# Patient Record
Sex: Male | Born: 1960 | Race: White | Hispanic: No | Smoking: Former smoker
Health system: Southern US, Community
[De-identification: ages and names within clinical notes are randomized; demographics above are authoritative.]

## PROBLEM LIST (undated history)

## (undated) DIAGNOSIS — R001 Bradycardia, unspecified: Secondary | ICD-10-CM

## (undated) DIAGNOSIS — E785 Hyperlipidemia, unspecified: Secondary | ICD-10-CM

## (undated) DIAGNOSIS — R7989 Other specified abnormal findings of blood chemistry: Secondary | ICD-10-CM

## (undated) DIAGNOSIS — I2510 Atherosclerotic heart disease of native coronary artery without angina pectoris: Secondary | ICD-10-CM

## (undated) DIAGNOSIS — Z720 Tobacco use: Secondary | ICD-10-CM

---

## 2008-09-07 ENCOUNTER — Emergency Department (HOSPITAL_BASED_OUTPATIENT_CLINIC_OR_DEPARTMENT_OTHER): Admission: EM | Admit: 2008-09-07 | Discharge: 2008-09-07 | Payer: Self-pay | Admitting: Emergency Medicine

## 2013-06-26 ENCOUNTER — Encounter (HOSPITAL_COMMUNITY): Payer: Self-pay | Admitting: Family Medicine

## 2013-06-26 ENCOUNTER — Inpatient Hospital Stay (HOSPITAL_COMMUNITY)
Admission: EM | Admit: 2013-06-26 | Discharge: 2013-06-28 | DRG: 249 | Disposition: A | Discharge status: Home / Self Care | Payer: 59 | Attending: Cardiology | Admitting: Cardiology

## 2013-06-26 ENCOUNTER — Emergency Department (HOSPITAL_COMMUNITY): Payer: Self-pay

## 2013-06-26 DIAGNOSIS — E039 Hypothyroidism, unspecified: Secondary | ICD-10-CM | POA: Diagnosis present

## 2013-06-26 DIAGNOSIS — I4510 Unspecified right bundle-branch block: Secondary | ICD-10-CM | POA: Diagnosis present

## 2013-06-26 DIAGNOSIS — I214 Non-ST elevation (NSTEMI) myocardial infarction: Principal | ICD-10-CM | POA: Diagnosis present

## 2013-06-26 DIAGNOSIS — Z720 Tobacco use: Secondary | ICD-10-CM

## 2013-06-26 DIAGNOSIS — R7989 Other specified abnormal findings of blood chemistry: Secondary | ICD-10-CM

## 2013-06-26 DIAGNOSIS — I2510 Atherosclerotic heart disease of native coronary artery without angina pectoris: Secondary | ICD-10-CM | POA: Diagnosis present

## 2013-06-26 DIAGNOSIS — I498 Other specified cardiac arrhythmias: Secondary | ICD-10-CM | POA: Diagnosis present

## 2013-06-26 DIAGNOSIS — I249 Acute ischemic heart disease, unspecified: Secondary | ICD-10-CM

## 2013-06-26 DIAGNOSIS — F17200 Nicotine dependence, unspecified, uncomplicated: Secondary | ICD-10-CM | POA: Diagnosis present

## 2013-06-26 DIAGNOSIS — Z8249 Family history of ischemic heart disease and other diseases of the circulatory system: Secondary | ICD-10-CM

## 2013-06-26 DIAGNOSIS — R001 Bradycardia, unspecified: Secondary | ICD-10-CM

## 2013-06-26 DIAGNOSIS — E785 Hyperlipidemia, unspecified: Secondary | ICD-10-CM

## 2013-06-26 DIAGNOSIS — Z955 Presence of coronary angioplasty implant and graft: Secondary | ICD-10-CM

## 2013-06-26 HISTORY — DX: Tobacco use: Z72.0

## 2013-06-26 HISTORY — DX: Bradycardia, unspecified: R00.1

## 2013-06-26 HISTORY — DX: Hyperlipidemia, unspecified: E78.5

## 2013-06-26 HISTORY — DX: Other specified abnormal findings of blood chemistry: R79.89

## 2013-06-26 HISTORY — DX: Atherosclerotic heart disease of native coronary artery without angina pectoris: I25.10

## 2013-06-26 LAB — CBC
HCT: 46.9 % (ref 39.0–52.0)
Hemoglobin: 16.3 g/dL (ref 13.0–17.0)
MCHC: 34.8 g/dL (ref 30.0–36.0)
RBC: 5.38 MIL/uL (ref 4.22–5.81)
WBC: 11.8 10*3/uL — ABNORMAL HIGH (ref 4.0–10.5)

## 2013-06-26 LAB — BASIC METABOLIC PANEL
BUN: 13 mg/dL (ref 6–23)
CO2: 23 mEq/L (ref 19–32)
Chloride: 107 mEq/L (ref 96–112)
GFR calc non Af Amer: 72 mL/min — ABNORMAL LOW (ref >90)
Glucose, Bld: 118 mg/dL — ABNORMAL HIGH (ref 70–99)
Potassium: 4.1 mEq/L (ref 3.5–5.1)
Sodium: 141 mEq/L (ref 135–145)

## 2013-06-26 LAB — POCT I-STAT TROPONIN I: Troponin i, poc: 0.93 ng/mL (ref 0.00–0.08)

## 2013-06-26 LAB — TROPONIN I: Troponin I: 3.24 ng/mL (ref <0.30)

## 2013-06-26 LAB — OCCULT BLOOD, POC DEVICE: Fecal Occult Bld: NEGATIVE

## 2013-06-26 MED ORDER — GI COCKTAIL ~~LOC~~
30.0000 mL | Freq: Once | ORAL | Status: AC
  Administered 2013-06-26: 30 mL via ORAL
  Filled 2013-06-26: qty 30

## 2013-06-26 MED ORDER — HEPARIN BOLUS VIA INFUSION
4000.0000 [IU] | Freq: Once | INTRAVENOUS | Status: AC
  Administered 2013-06-26: 4000 [IU] via INTRAVENOUS

## 2013-06-26 MED ORDER — NITROGLYCERIN 2 % TD OINT
0.5000 [in_us] | TOPICAL_OINTMENT | Freq: Three times a day (TID) | TRANSDERMAL | Status: DC
  Administered 2013-06-27 – 2013-06-28 (×3): 0.5 [in_us] via TOPICAL
  Filled 2013-06-26: qty 30
  Filled 2013-06-26: qty 1

## 2013-06-26 MED ORDER — HEPARIN (PORCINE) IN NACL 100-0.45 UNIT/ML-% IJ SOLN
1200.0000 [IU]/h | INTRAMUSCULAR | Status: DC
  Administered 2013-06-26: 1000 [IU]/h via INTRAVENOUS
  Administered 2013-06-27: 10:00:00 1200 [IU]/h via INTRAVENOUS
  Filled 2013-06-26 (×2): qty 250

## 2013-06-26 MED ORDER — TETANUS-DIPHTH-ACELL PERTUSSIS 5-2.5-18.5 LF-MCG/0.5 IM SUSP
0.5000 mL | Freq: Once | INTRAMUSCULAR | Status: AC
  Administered 2013-06-26: 0.5 mL via INTRAMUSCULAR
  Filled 2013-06-26: qty 0.5

## 2013-06-26 MED ORDER — NITROGLYCERIN 0.4 MG SL SUBL
0.4000 mg | SUBLINGUAL_TABLET | SUBLINGUAL | Status: DC | PRN
  Administered 2013-06-26: 0.4 mg via SUBLINGUAL
  Filled 2013-06-26 (×2): qty 25

## 2013-06-26 MED ORDER — SODIUM CHLORIDE 0.9 % IV BOLUS (SEPSIS)
500.0000 mL | Freq: Once | INTRAVENOUS | Status: AC
  Administered 2013-06-26: 500 mL via INTRAVENOUS

## 2013-06-26 MED ORDER — ASPIRIN 81 MG PO CHEW
324.0000 mg | CHEWABLE_TABLET | Freq: Once | ORAL | Status: AC
  Administered 2013-06-26: 324 mg via ORAL
  Filled 2013-06-26: qty 1
  Filled 2013-06-26: qty 3

## 2013-06-26 NOTE — ED Notes (Signed)
No answer after called x 3

## 2013-06-26 NOTE — ED Notes (Signed)
Per pt sts pressure in his chest since about 12:00. sts radiating down both arm and nausea. sts pain 9/10

## 2013-06-26 NOTE — ED Provider Notes (Signed)
History    CSN: [id number] Arrival date & time 06/26/13  1842  First MD Initiated Contact with Patient 06/26/13 1921     Chief Complaint  Patient presents with  . Chest Pain   HPI  History provided by the patient and significant other. Patient is a 52 year old male who is occurring every day smoker without a primary care provider or other known PMH presenting with complaints of persistent mid substernal chest pressure, burning and discomfort. Symptoms first began around noon and has continued throughout the day. Patient also has some pain radiating into the right upper extremity with numbness in the arm. Denies radiation to the left extremity. Symptoms were associated with nausea and episode of vomiting with blood and some blood clots. Patient denies having any epigastric pains. Denies any significant history of acid reflux or GERD. Patient has not taken any medications for his symptoms. There is no associated diaphoresis or shortness of breath. No heart palpitations. Patient does report: With his wife on a motorcycle ride yesterday for 2-3 hours in the hot sun. He states they both felt tired and slightly bad after being all all day as if they were slightly dehydrated. He denies any other recent changes. He had normal appetite this morning. Denies any fever, chills or sweats. No cough. No hemoptysis. No pleuritic chest pain. No swelling of the extremities.    History reviewed. No pertinent past medical history. History reviewed. No pertinent past surgical history. History reviewed. No pertinent family history. History  Substance Use Topics  . Smoking status: Current Every Day Smoker  . Smokeless tobacco: Not on file  . Alcohol Use: No    Review of Systems  Constitutional: Negative for fever, chills, diaphoresis and appetite change.  Respiratory: Negative for cough and shortness of breath.   Cardiovascular: Positive for chest pain. Negative for palpitations and leg swelling.   Gastrointestinal: Positive for nausea and vomiting. Negative for abdominal pain, diarrhea and constipation.  Neurological: Negative for headaches.  All other systems reviewed and are negative.    Allergies  Review of patient's allergies indicates no known allergies.  Home Medications  No current outpatient prescriptions on file. BP 121/82  Pulse 67  Temp(Src) 98.3 F (36.8 C) (Oral)  Resp 16  Ht 5\' 9"  (1.753 m)  Wt 196 lb (88.905 kg)  BMI 28.93 kg/m2  SpO2 96% Physical Exam  Nursing note and vitals reviewed. Constitutional: He is oriented to person, place, and time. He appears well-developed and well-nourished. No distress.  HENT:  Head: Normocephalic and atraumatic.  Mouth/Throat: Oropharynx is clear and moist.  Neck: Normal range of motion. Neck supple. No JVD present. No thyromegaly present.  Cardiovascular: Normal rate and regular rhythm.   No murmur heard. Pulmonary/Chest: Effort normal and breath sounds normal. No stridor. No respiratory distress. He has no wheezes. He has no rales.  Abdominal: Soft. He exhibits no distension. There is no tenderness. There is no rebound and no guarding.  Musculoskeletal: Normal range of motion. He exhibits no edema and no tenderness.  Neurological: He is alert and oriented to person, place, and time. He has normal strength. No sensory deficit.  Skin: Skin is warm.  Psychiatric: He has a normal mood and affect. His behavior is normal.    ED Course  Procedures  Results for orders placed during the hospital encounter of 06/26/13  CBC      Result Value Range   WBC 11.8 (*) 4.0 - 10.5 K/uL   RBC 5.38  4.22 -  5.81 MIL/uL   Hemoglobin 16.3  13.0 - 17.0 g/dL   HCT 16.1  09.6 - 04.5 %   MCV 87.2  78.0 - 100.0 fL   MCH 30.3  26.0 - 34.0 pg   MCHC 34.8  30.0 - 36.0 g/dL   RDW 40.9  81.1 - 91.4 %   Platelets 241  150 - 400 K/uL  BASIC METABOLIC PANEL      Result Value Range   Sodium 141  135 - 145 mEq/L   Potassium 4.1  3.5 - 5.1  mEq/L   Chloride 107  96 - 112 mEq/L   CO2 23  19 - 32 mEq/L   Glucose, Bld 118 (*) 70 - 99 mg/dL   BUN 13  6 - 23 mg/dL   Creatinine, Ser 7.82  0.50 - 1.35 mg/dL   Calcium 9.6  8.4 - 95.6 mg/dL   GFR calc non Af Amer 72 (*) >90 mL/min   GFR calc Af Amer 83 (*) >90 mL/min  TROPONIN I      Result Value Range   Troponin I 3.24 (*) <0.30 ng/mL  POCT I-STAT TROPONIN I      Result Value Range   Troponin i, poc 0.05  0.00 - 0.08 ng/mL   Comment 3           POCT I-STAT TROPONIN I      Result Value Range   Troponin i, poc 0.93 (*) 0.00 - 0.08 ng/mL   Comment NOTIFIED PHYSICIAN     Comment 3           OCCULT BLOOD, POC DEVICE      Result Value Range   Fecal Occult Bld NEGATIVE  NEGATIVE        Dg Chest 2 View  06/26/2013   *RADIOLOGY REPORT*  Clinical Data: Chest pain, cough  CHEST - 2 VIEW  Comparison: None.  Findings: There is mild diffuse interstitial prominence.  No focal airspace consolidation.  No effusion.  No overt edema.  Heart size normal.  Regional bones unremarkable.  IMPRESSION:  Mild interstitial prominence of uncertain chronicity.   Original Report Authenticated By: D. Andria Rhein, MD      1. NSTEMI (non-ST elevated myocardial infarction)   2. ACS (acute coronary syndrome)   3. Non-STEMI (non-ST elevated myocardial infarction)       MDM  8:25 PM patient seen and evaluated. Patient resting appears comfortable no acute distress. He continues to have some central substernal chest pressure, burning and discomfort.  10:00 PM patient with second elevated troponin. He does report slight improvements of chest pain or mild persistent discomfort. Stable blood pressure and heart rate. Patient discussed with attending physician. Will consult cardiology and give nitroglycerin.  10:20PM Hemoccult is negative however with history of possible hematemesis we'll consult cardiology first before starting heparin  10:40PM Spoke with Cardiology they will come and see pt.  They would  like repeat troponin sent to lab.  Cardiology has seen patient and will admit    Date: 06/26/2013  Rate: 58  Rhythm: sinus bradycardia  QRS Axis: normal  Intervals: normal  ST/T Wave abnormalities: nonspecific ST/T changes  Conduction Disutrbances: Incomplete right bundle branch block  Narrative Interpretation: Possible RVH, abnormal EKG  Old EKG Reviewed: none available       Date: 06/26/2013  Rate: 51  Rhythm: sinus bradycardia  QRS Axis: normal  Intervals: normal  ST/T Wave abnormalities: nonspecific ST/T changes  Conduction Disutrbances: Incomplete RBBB  Narrative Interpretation:  Old EKG Reviewed: unchanged      Angus Seller, PA-C 06/26/13 2346

## 2013-06-26 NOTE — H&P (Addendum)
Physician History and Physical    Vincent Henry MRN: [id number] DOB/AGE: 1961/05/20 52 y.o. Admit date: 06/26/2013  HPI: 52 yo with history of smoking presents with chest pain and suspected NSTEMI.  Patient has minimal past history and no known cardiac problems.  He has not seen a doctor for years.  Today, he was at working Social worker.  Around noon, he developed substernal chest pressure after eating a hotdog for lunch.  The pain continued on and off all afternoon.  Around 5:30, he got nauseated and vomited x 1.  The emesis was dark but he had had a Coke prior to vomiting.  Later that evening, he had chest pressure with numbness/tingling in his right arm so decided to come to the ER.  In the ER, a GI cocktail helped some but NTG sublingual resolved pain completely.  He is now pain-free.  Initial troponin was negative but repeat troponin was elevated to 0.93.  ECG showed lateral TWIs and biphasic Ts in V4/V5. Patient was guaiac negative in the ER.   Patient smokes about 1.5 ppd.  He also had a brother with stents placed in his early 91s and father with CABG in his 76s.   Review of systems complete and found to be negative unless listed above   PMH: - Active smoker  FH: Father with CABG in his 56s.  Brother with PCI in his 73s.   History   Social History  . Marital Status: Married    Spouse Name: N/A    Number of Children: N/A  . Years of Education: N/A   Occupational History  . Not on file.   Social History Main Topics  . Smoking status: Current Every Day Smoker, 1.5 ppd  . Smokeless tobacco: Not on file  . Alcohol Use: No  . Drug Use: Not on file  . Sexually Active: Not on file   Other Topics Concern  . Not on file   Social History Narrative  . Lives in [place].  Repairs golf carts    Physical Exam: Blood pressure 126/77, pulse 67, temperature 98.3 F (36.8 C), temperature source Oral, resp. rate 16, height 5\' 9"  (1.753 m), weight 196 lb (88.905 kg), SpO2  99.00%.  General: NAD Neck: No JVD, no thyromegaly or thyroid nodule.  Lungs: Rhonchi bilaterally CV: Nondisplaced PMI.  Heart regular S1/S2, no S3/S4, no murmur.  No peripheral edema.  No carotid bruit.  Normal pedal pulses.  Abdomen: Soft, nontender, no hepatosplenomegaly, no distention.  Skin: Intact without lesions or rashes.  Neurologic: Alert and oriented x 3.  Psych: Normal affect. Extremities: No clubbing or cyanosis.  HEENT: Normal.   Labs:   Lab Results  Component Value Date   WBC 11.8* 06/26/2013   HGB 16.3 06/26/2013   HCT 46.9 06/26/2013   MCV 87.2 06/26/2013   PLT 241 06/26/2013    Recent Labs Lab 06/26/13 1853  NA 141  K 4.1  CL 107  CO2 23  BUN 13  CREATININE 1.17  CALCIUM 9.6  GLUCOSE 118*  TnI 0.05 => 0.93   Radiology: - CXR: interstitial prominence  EKG: NSR, rSR' V1, lateral TWIs, biphasic Ts in V4/V5  ASSESSMENT AND PLAN:  52 yo smoker with strong FH of CAD presents with NSTEMI.  1. CAD: NSTEMI with elevated troponin and T wave inversions on ECG.  He is currently CP-free after NTG. - Start heparin gtt, place NTG paste 1/2 inch. - He has had ASA. - High dose statin.  -  Hold off on beta blocker with bradycardia (HR around 50).   - LHC in am if he remains CP-free.  2. Smoking: Active smoker.  I strongly encouraged him to quit.  Will place nicotine patch.  3. Bradycardia: Sinus brady with HR high 40s-50s.  No beta blocker.  Follow.   Signed: Marca Ancona 06/26/2013, 10:43 PM

## 2013-06-26 NOTE — ED Notes (Signed)
CRITICAL LAB RESULTS SHOWN TO DR.PICKERING

## 2013-06-26 NOTE — ED Notes (Signed)
No answer  When called from lobby

## 2013-06-26 NOTE — Progress Notes (Signed)
ANTICOAGULATION CONSULT NOTE - Initial Consult  Pharmacy Consult for Heparin Indication: chest pain/ACS  No Known Allergies  Patient Measurements: Height: 5\' 9"  (175.3 cm) Weight: 196 lb (88.905 kg) IBW/kg (Calculated) : 70.7  Vital Signs: Temp: 98.3 F (36.8 C) (07/14 1845) Temp src: Oral (07/14 1845) BP: 88/47 mmHg (07/14 2300) Pulse Rate: 67 (07/14 1845)  Labs:  Recent Labs  06/26/13 1853  HGB 16.3  HCT 46.9  PLT 241  CREATININE 1.17    Estimated Creatinine Clearance: 84.3 ml/min (by C-G formula based on Cr of 1.17).   Medical History: History reviewed. No pertinent past medical history.  Medications:  None  Assessment: 52 y.o. male with chest pain for heparin  Goal of Therapy:  Heparin level 0.3-0.7 units/ml Monitor platelets by anticoagulation protocol: Yes   Plan:  Heparin 4000 units IV bolus, then 1000 units/hr Check heparin level in 6 hours.  Eddie Candle 06/26/2013,11:32 PM

## 2013-06-27 ENCOUNTER — Encounter (HOSPITAL_COMMUNITY)
Admission: EM | Disposition: A | Discharge status: Home / Self Care | Payer: Self-pay | Source: Home / Self Care | Attending: Cardiology

## 2013-06-27 ENCOUNTER — Other Ambulatory Visit: Payer: Self-pay

## 2013-06-27 DIAGNOSIS — I214 Non-ST elevation (NSTEMI) myocardial infarction: Secondary | ICD-10-CM

## 2013-06-27 DIAGNOSIS — I2510 Atherosclerotic heart disease of native coronary artery without angina pectoris: Secondary | ICD-10-CM

## 2013-06-27 HISTORY — PX: PERCUTANEOUS STENT INTERVENTION: SHX5500

## 2013-06-27 HISTORY — DX: Atherosclerotic heart disease of native coronary artery without angina pectoris: I25.10

## 2013-06-27 HISTORY — PX: CORONARY ANGIOPLASTY WITH STENT PLACEMENT: SHX49

## 2013-06-27 HISTORY — PX: LEFT HEART CATHETERIZATION WITH CORONARY ANGIOGRAM: SHX5451

## 2013-06-27 LAB — BASIC METABOLIC PANEL
BUN: 14 mg/dL (ref 6–23)
CO2: 22 mEq/L (ref 19–32)
Calcium: 8.9 mg/dL (ref 8.4–10.5)
Creatinine, Ser: 1.15 mg/dL (ref 0.50–1.35)
GFR calc non Af Amer: 73 mL/min — ABNORMAL LOW (ref >90)
Glucose, Bld: 94 mg/dL (ref 70–99)
Sodium: 141 mEq/L (ref 135–145)

## 2013-06-27 LAB — PROTIME-INR
INR: 1.04 (ref 0.00–1.49)
INR: 1.12 (ref 0.00–1.49)
Prothrombin Time: 13.4 seconds (ref 11.6–15.2)
Prothrombin Time: 14.2 seconds (ref 11.6–15.2)

## 2013-06-27 LAB — LIPID PANEL
Cholesterol: 151 mg/dL (ref 0–200)
Total CHOL/HDL Ratio: 4.7 RATIO

## 2013-06-27 LAB — TROPONIN I: Troponin I: 4.45 ng/mL (ref <0.30)

## 2013-06-27 LAB — CBC
MCH: 29.8 pg (ref 26.0–34.0)
MCHC: 34.1 g/dL (ref 30.0–36.0)
MCV: 87.4 fL (ref 78.0–100.0)
Platelets: 181 10*3/uL (ref 150–400)
RDW: 13.5 % (ref 11.5–15.5)

## 2013-06-27 LAB — APTT: aPTT: 30 seconds (ref 24–37)

## 2013-06-27 LAB — TSH: TSH: 12.332 u[IU]/mL — ABNORMAL HIGH (ref 0.350–4.500)

## 2013-06-27 LAB — POCT ACTIVATED CLOTTING TIME: Activated Clotting Time: 462 seconds

## 2013-06-27 SURGERY — LEFT HEART CATHETERIZATION WITH CORONARY ANGIOGRAM
Anesthesia: LOCAL

## 2013-06-27 MED ORDER — SODIUM CHLORIDE 0.9 % IV SOLN: 0.2500 mg/kg/h | INTRAVENOUS | Status: AC

## 2013-06-27 MED ORDER — ONDANSETRON HCL 4 MG/2ML IJ SOLN: 4.0000 mg | Freq: Four times a day (QID) | INTRAMUSCULAR | Status: DC | PRN

## 2013-06-27 MED ORDER — PRASUGREL HCL 10 MG PO TABS
10.0000 mg | ORAL_TABLET | Freq: Every day | ORAL | Status: DC
  Filled 2013-06-27 (×2): qty 1

## 2013-06-27 MED ORDER — ASPIRIN 81 MG PO CHEW: 324.0000 mg | CHEWABLE_TABLET | ORAL | Status: DC

## 2013-06-27 MED ORDER — SODIUM CHLORIDE 0.9 % IV SOLN: 250.0000 mL | INTRAVENOUS | Status: DC | PRN

## 2013-06-27 MED ORDER — PRASUGREL HCL 10 MG PO TABS
ORAL_TABLET | ORAL | Status: AC
  Administered 2013-06-28: 10:00:00 10 mg via ORAL
  Filled 2013-06-27: qty 6

## 2013-06-27 MED ORDER — SODIUM CHLORIDE 0.9 % IV SOLN
INTRAVENOUS | Status: AC
  Administered 2013-06-27: 16:00:00 via INTRAVENOUS

## 2013-06-27 MED ORDER — SODIUM CHLORIDE 0.9 % IV SOLN
1.0000 mL/kg/h | INTRAVENOUS | Status: DC
  Administered 2013-06-27 (×2): 1 mL/kg/h via INTRAVENOUS

## 2013-06-27 MED ORDER — SODIUM CHLORIDE 0.9 % IJ SOLN: 3.0000 mL | INTRAMUSCULAR | Status: DC | PRN

## 2013-06-27 MED ORDER — SODIUM CHLORIDE 0.9 % IJ SOLN: 3.0000 mL | Freq: Two times a day (BID) | INTRAMUSCULAR | Status: DC

## 2013-06-27 MED ORDER — NITROGLYCERIN 0.2 MG/ML ON CALL CATH LAB
INTRAVENOUS | Status: AC
  Filled 2013-06-27: qty 1

## 2013-06-27 MED ORDER — LIDOCAINE HCL (PF) 1 % IJ SOLN
INTRAMUSCULAR | Status: AC
  Filled 2013-06-27: qty 30

## 2013-06-27 MED ORDER — MIDAZOLAM HCL 2 MG/2ML IJ SOLN
INTRAMUSCULAR | Status: AC
  Filled 2013-06-27: qty 2

## 2013-06-27 MED ORDER — VERAPAMIL HCL 2.5 MG/ML IV SOLN
INTRAVENOUS | Status: AC
  Filled 2013-06-27: qty 2

## 2013-06-27 MED ORDER — ASPIRIN 300 MG RE SUPP: 300.0000 mg | RECTAL | Status: DC

## 2013-06-27 MED ORDER — HEPARIN (PORCINE) IN NACL 2-0.9 UNIT/ML-% IJ SOLN
INTRAMUSCULAR | Status: AC
  Filled 2013-06-27: qty 1000

## 2013-06-27 MED ORDER — FENTANYL CITRATE 0.05 MG/ML IJ SOLN
INTRAMUSCULAR | Status: AC
  Filled 2013-06-27: qty 2

## 2013-06-27 MED ORDER — ASPIRIN 81 MG PO CHEW
324.0000 mg | CHEWABLE_TABLET | ORAL | Status: AC
  Administered 2013-06-27: 324 mg via ORAL
  Filled 2013-06-27: qty 4

## 2013-06-27 MED ORDER — BIVALIRUDIN 250 MG IV SOLR
INTRAVENOUS | Status: AC
  Filled 2013-06-27: qty 250

## 2013-06-27 MED ORDER — ATORVASTATIN CALCIUM 80 MG PO TABS
80.0000 mg | ORAL_TABLET | Freq: Every day | ORAL | Status: DC
  Administered 2013-06-27 (×2): 80 mg via ORAL
  Filled 2013-06-27 (×3): qty 1

## 2013-06-27 MED ORDER — ASPIRIN EC 81 MG PO TBEC
81.0000 mg | DELAYED_RELEASE_TABLET | Freq: Every day | ORAL | Status: DC
  Administered 2013-06-28: 81 mg via ORAL
  Filled 2013-06-27 (×2): qty 1

## 2013-06-27 MED ORDER — ACETAMINOPHEN 325 MG PO TABS: 650.0000 mg | ORAL_TABLET | ORAL | Status: DC | PRN

## 2013-06-27 MED ORDER — HEPARIN SODIUM (PORCINE) 1000 UNIT/ML IJ SOLN
INTRAMUSCULAR | Status: AC
  Filled 2013-06-27: qty 1

## 2013-06-27 MED ORDER — NITROGLYCERIN 0.4 MG SL SUBL: 0.4000 mg | SUBLINGUAL_TABLET | SUBLINGUAL | Status: DC | PRN

## 2013-06-27 NOTE — Interval H&P Note (Signed)
History and Physical Interval Note:  06/27/2013 11:36 AM  Vincent Henry  has presented today for surgery, with the diagnosis of NSTEMI  The various methods of treatment have been discussed with the patient and family. After consideration of risks, benefits and other options for treatment, the patient has consented to  Procedure(s): LEFT HEART CATHETERIZATION WITH CORONARY ANGIOGRAM (N/A) as a surgical intervention .  The patient's history has been reviewed, patient examined, no change in status, stable for surgery.  I have reviewed the patient's chart and labs.  Questions were answered to the patient's satisfaction.    Patient's troponin is > 4.  Agree with plans for cath.  Radial approach .  Elyn Aquas.

## 2013-06-27 NOTE — ED Provider Notes (Signed)
Medical screening examination/treatment/procedure(s) were performed by non-physician practitioner and as supervising physician I was immediately available for consultation/collaboration.  Juliet Rude. Rubin Payor, MD 06/27/13 4098

## 2013-06-27 NOTE — CV Procedure (Signed)
    Cardiac Cath Note  Vincent Henry [id number] Mar 22, 1961  Procedure: left  Heart Cardiac Catheterization Note Indications: NSTEMI  Procedure Details Consent: Obtained Time Out: Verified patient identification, verified procedure, site/side was marked, verified correct patient position, special equipment/implants available, Radiology Safety Procedures followed,  medications/allergies/relevent history reviewed, required imaging and test results available.  Performed   Medications: Fentanyl: 50 mcg IV Versed: 2 mg IV Heparin 4500 units Verapamil 3 mg ia  The right radial  artery was easily canulated using a modified Seldinger technique.  Hemodynamics:    LV pressure: 108/16 Aortic pressure: 101/65  Angiography   Left Main: large, smooth and normal  Left anterior Descending: moderate sized, minor luminal irregularities proximal  Left Circumflex: moderate sized vessel,  Minor luminal irreg  Right Coronary Artery: large, 90% ulcerated lesion in the prox vessel.  LV Gram: normal LV function.  EF 60%  Complications: No apparent complications Patient did tolerate procedure well.  Contrast used: 60 cc  Conclusions:   1. Single vessel CAD - RCA.   2. Normal LV function.  Have discussed with Dr. Kirke Corin,   Will proceed with PCI.   Vesta Mixer, Montez Hageman., MD, [facility] 06/27/2013, 12:25 PM Office - [phone] Pager [phone]

## 2013-06-27 NOTE — Progress Notes (Signed)
TR BAND REMOVAL  LOCATION:    right radial  DEFLATED PER PROTOCOL:    yes  TIME BAND OFF / DRESSING APPLIED:    1900    SITE UPON ARRIVAL:    Level 0  SITE AFTER BAND REMOVAL:    Level 0  REVERSE ALLEN'S TEST:     positive  CIRCULATION SENSATION AND MOVEMENT:    Within Normal Limits   yes  COMMENTS:   Gauze dressing applied at 1900.  Right radial site rechecked at 1930 gauze dressing dry and intact. CSMs wnls and unchanged .

## 2013-06-27 NOTE — CV Procedure (Signed)
   CARDIAC CATH NOTE  Name: Vincent Henry MRN: [id number] DOB: [birth year]  Procedure: PTCA and stenting of the proximal RCA.   Indication: NSTEMI.   Medications:    Contrast:  20 ml Omnipaque  Procedural Details:  Weight-based bivalirudin was given for anticoagulation. Once a therapeutic ACT was achieved, a 6 [place] JR4 guide catheter was inserted.  A Runthrough coronary guidewire was used to cross the lesion.  The lesion was predilated with a 3.0 X15 mm balloon.  The lesion was then stented with a 4.0 X 24 mm Veriflex bare metal stent.  The stent was postdilated with a 4.5 X 20 mm noncompliant balloon.  Following PCI, there was 0% residual stenosis and TIMI-3 flow. Final angiography confirmed an excellent result. The patient tolerated the procedure well. There were no immediate procedural complications. A TR band was used for radial hemostasis. The patient was transferred to the post catheterization recovery area for further monitoring.  PCI Data: Vessel - Proximal RCA /Segment - 1 Percent Stenosis (pre)  90% TIMI-flow 3 Stent: 4.0 X 24 mm Veriflex bare metal stent Percent Stenosis (post) 0 % TIMI-flow (post) 3  Final Conclusions:  Successful PCI and bare metal stent placement to proximal RCA.   Recommendations:  Dual antiplatelet therapy for at least 1 month and ideally 12 months. A bare metal stent was used due to large vessel size. Avoid beta blockers for now due to bradycardia. Recommend smoking cessation and cardiac rehab.   Lorine Bears MD, [facility] 06/27/2013, 12:53 PM

## 2013-06-27 NOTE — Progress Notes (Addendum)
CRITICAL VALUE ALERT  Critical value received:  Troponin I is 4.45  Date of notification: 06/27/2013  Time of notification:  0922  Critical value read back:yes  Nurse who received alert:  Victorino December RN  MD notified (1st page):  Paged [place] cardmaster  Time of first page:  [phone]  MD notified (2nd page): Odella Aquas PA  Time of second page: [phone]  Responding MD:  Odella Aquas PA  Time MD responded:  [phone]

## 2013-06-27 NOTE — Progress Notes (Signed)
Patient transferred to cath lab via bed with transporter and RN. Patients wife brought to short stay waiting area and given a pager with instructions. Patient monitored by CMT during transfer and RN holding phone for communication with CMT. No monitor changes.

## 2013-06-27 NOTE — Progress Notes (Signed)
ANTICOAGULATION CONSULT NOTE - Initial Consult  Pharmacy Consult for Heparin Indication: chest pain/ACS  No Known Allergies  Patient Measurements: Height: 5\' 9"  (175.3 cm) Weight: 196 lb 13.9 oz (89.3 kg) IBW/kg (Calculated) : 70.7 Heparin dosing weight: 88 kg  Vital Signs: Temp: 97.7 F (36.5 C) (07/15 0808) Temp src: Oral (07/15 0808) BP: 115/74 mmHg (07/15 0808) Pulse Rate: 70 (07/15 0808)  Labs:  Recent Labs  06/26/13 1853 06/26/13 2238 06/27/13 0005 06/27/13 0147 06/27/13 0810  HGB 16.3  --   --   --  14.2  HCT 46.9  --   --   --  41.6  PLT 241  --   --   --  181  APTT  --   --  30  --   --   LABPROT  --   --  13.4  --  14.2  INR  --   --  1.04  --  1.12  HEPARINUNFRC  --   --  RESULTS UNAVAILABLE DUE TO INTERFERING SUBSTANCE  --  0.25*  CREATININE 1.17  --   --   --   --   TROPONINI  --  3.24*  --  <0.30 4.45*    Estimated Creatinine Clearance: 84.4 ml/min (by C-G formula based on Cr of 1.17).   Medical History: History reviewed. No pertinent past medical history.  Medications:  None  Assessment: 52 y.o. male on IV heparin for NSTEMI, awaiting for cath today. Heparin level (0.25) is slightly below goal, per RN, heparin infusion has not been stopped. No bleeding noted per chart.   Goal of Therapy:  Heparin level 0.3-0.7 units/ml Monitor platelets by anticoagulation protocol: Yes   Plan:  Increase heparin infusion rate to 1200 units/hr Check heparin level in 6 hours or f/u after cath  Bayard Hugger, PharmD, BCPS  Clinical Pharmacist  Pager: [phone]   06/27/2013,9:25 AM

## 2013-06-28 ENCOUNTER — Encounter (HOSPITAL_COMMUNITY): Payer: Self-pay | Admitting: Physician Assistant

## 2013-06-28 DIAGNOSIS — E785 Hyperlipidemia, unspecified: Secondary | ICD-10-CM

## 2013-06-28 DIAGNOSIS — Z720 Tobacco use: Secondary | ICD-10-CM

## 2013-06-28 DIAGNOSIS — R001 Bradycardia, unspecified: Secondary | ICD-10-CM

## 2013-06-28 DIAGNOSIS — I214 Non-ST elevation (NSTEMI) myocardial infarction: Secondary | ICD-10-CM

## 2013-06-28 DIAGNOSIS — I2510 Atherosclerotic heart disease of native coronary artery without angina pectoris: Secondary | ICD-10-CM

## 2013-06-28 DIAGNOSIS — R7989 Other specified abnormal findings of blood chemistry: Secondary | ICD-10-CM

## 2013-06-28 LAB — BASIC METABOLIC PANEL
BUN: 12 mg/dL (ref 6–23)
CO2: 23 mEq/L (ref 19–32)
Calcium: 9 mg/dL (ref 8.4–10.5)
Creatinine, Ser: 1.25 mg/dL (ref 0.50–1.35)
Glucose, Bld: 93 mg/dL (ref 70–99)
Sodium: 141 mEq/L (ref 135–145)

## 2013-06-28 LAB — CBC
HCT: 40.4 % (ref 39.0–52.0)
Hemoglobin: 14.3 g/dL (ref 13.0–17.0)
MCH: 30.8 pg (ref 26.0–34.0)
MCV: 87.1 fL (ref 78.0–100.0)
RBC: 4.64 MIL/uL (ref 4.22–5.81)

## 2013-06-28 MED ORDER — PRASUGREL HCL 10 MG PO TABS: 10.0000 mg | ORAL_TABLET | Freq: Every day | ORAL | Status: DC

## 2013-06-28 MED ORDER — NITROGLYCERIN 0.4 MG SL SUBL: 0.4000 mg | SUBLINGUAL_TABLET | SUBLINGUAL | Status: DC | PRN

## 2013-06-28 MED ORDER — ATORVASTATIN CALCIUM 80 MG PO TABS: 80.0000 mg | ORAL_TABLET | Freq: Every day | ORAL | Status: DC

## 2013-06-28 MED ORDER — ASPIRIN 81 MG PO TBEC: 81.0000 mg | DELAYED_RELEASE_TABLET | Freq: Every day | ORAL | Status: DC

## 2013-06-28 MED ORDER — HEART ATTACK BOUNCING BOOK
Freq: Once | Status: AC
  Administered 2013-06-28: 01:00:00
  Filled 2013-06-28: qty 1

## 2013-06-28 MED FILL — Sodium Chloride IV Soln 0.9%: INTRAVENOUS | Qty: 50 | Status: AC

## 2013-06-28 NOTE — Progress Notes (Signed)
   SUBJECTIVE: Doing well. No chest pain or dyspnea.    Filed Vitals:   06/27/13 2036 06/28/13 0007 06/28/13 0534 06/28/13 0849  BP: 136/67 100/61 135/76 143/84  Pulse: 66 55 58 63  Temp: 98.4 F (36.9 C) 97.9 F (36.6 C) 97.2 F (36.2 C) 97.8 F (36.6 C)  TempSrc: Oral Oral Oral Oral  Resp: 16 18 20 20   Height:      Weight:  90.6 kg (199 lb 11.8 oz)    SpO2: 95% 92% 96% 96%    Intake/Output Summary (Last 24 hours) at 06/28/13 1226 Last data filed at 06/28/13 0900  Gross per 24 hour  Intake   1430 ml  Output    200 ml  Net   1230 ml    LABS: Basic Metabolic Panel:  Recent Labs  40/98/11 0810 06/28/13 0511  NA 141 141  K 4.2 4.0  CL 110 111  CO2 22 23  GLUCOSE 94 93  BUN 14 12  CREATININE 1.15 1.25  CALCIUM 8.9 9.0   Liver Function Tests: No results found for this basename: AST, ALT, ALKPHOS, BILITOT, PROT, ALBUMIN,  in the last 72 hours No results found for this basename: LIPASE, AMYLASE,  in the last 72 hours CBC:  Recent Labs  06/27/13 0810 06/28/13 0511  WBC 9.4 8.8  HGB 14.2 14.3  HCT 41.6 40.4  MCV 87.4 87.1  PLT 181 183   Cardiac Enzymes:  Recent Labs  06/27/13 0147 06/27/13 0810 06/27/13 2300  TROPONINI <0.30 4.45* 2.79*   BNP: No components found with this basename: POCBNP,  D-Dimer: No results found for this basename: DDIMER,  in the last 72 hours Hemoglobin A1C: No results found for this basename: HGBA1C,  in the last 72 hours Fasting Lipid Panel:  Recent Labs  06/27/13 0810  CHOL 151  HDL 32*  LDLCALC 101*  TRIG 91  CHOLHDL 4.7   Thyroid Function Tests:  Recent Labs  06/27/13 0147  TSH 12.332*   Anemia Panel: No results found for this basename: VITAMINB12, FOLATE, FERRITIN, TIBC, IRON, RETICCTPCT,  in the last 72 hours   PHYSICAL EXAM General: Well developed, well nourished, in no acute distress HEENT:  Normocephalic and atramatic Neck:  No JVD.  Lungs: Clear bilaterally to auscultation and  percussion. Heart: HRRR . Normal S1 and S2 without gallops or murmurs.  Abdomen: Bowel sounds are positive, abdomen soft and non-tender  Msk:  Back normal, normal gait. Normal strength and tone for age. Extremities: No clubbing, cyanosis or edema.   Neuro: Alert and oriented X 3. Psych:  Good affect, responds appropriately Right radial pulse is normal with no hematoma.   TELEMETRY: Reviewed telemetry pt in sinus bradycardia.   ASSESSMENT AND PLAN:   1. CAD:  Cardiac cath showed severe 1 vessel CAD. S/p PCI and BMS placement to proximal RCA. Continue Aspirin indefinitely and Effient for at least 1 month and ideally for 1 year  - Hold off on beta blocker with bradycardia (HR around 50). Continue Atorvastatin.  Referred to cardiac rehab.   2. Smoking: Active smoker. I strongly encouraged him to quit.  3. Bradycardia: Sinus brady with HR high 40s-50s. No beta blocker.  Discharge home today.    Lorine Bears, MD, [facility] 06/28/2013 12:26 PM

## 2013-06-28 NOTE — Progress Notes (Signed)
CARDIAC REHAB PHASE I   PRE:  Rate/Rhythm: 58 SB  BP:  Supine: 110/68  Sitting:   Standing:    SaO2:   MODE:  Ambulation: 1000 ft   POST:  Rate/Rhythm: 65 SR  BP:  Supine:   Sitting: 143/84  Standing:    SaO2:  0750-0905 Pt tolerated ambulation well without c/o of cp or SOB. VS stable Pt to side of bed after walk with call light in reach. Completed MI and stent education with pt. Discussed smoking cessation with pt. I gave him tips for quittting and coaching contact number. He is motivated to quitting.Discussed Outpt. CRP with pt, he agrees to referral to [facility] program.  Melina Copa RN 06/28/2013 9:04 AM

## 2013-06-28 NOTE — Discharge Summary (Signed)
Discharge Summary   Patient ID: Vincent Henry,  MRN: [id number], DOB/AGE: 1961-11-24 52 y.o.  Admit date: 06/26/2013 Discharge date: 06/28/2013  Primary Physician: No PCP Per Patient Primary Cardiologist: New- evaluated by Golden Circle, MD  Discharge Diagnoses Principal Problem:   NSTEMI (non-ST elevated myocardial infarction)  - TnI trend 0.05->0.93->3.24->4.45->2.79  - Cardiac cath + PCI 06/27/13: 90% prox ulcerate plaque s/p BMS; EF 60%  - DAPT- ASA/Effient x at least 1 month  - TCM follow-up in 7-14 days  Active Problems:   CAD (coronary artery disease), native coronary artery  - As above  - To be discharged on ASA/Effient, statin, NTG SL PRN   Sinus bradycardia  - HR 40-50s, asymptomatic  - Precluding beta blockade   Tobacco abuse  - Nicotine patches this admission, elected to quit w/o assistance on discharge   Hyperlipidemia  - Lipid panel - LDL 101, HDL 32, TG 91, TC 151  - Discharged on high dose atorvastatin   Elevated TSH  - 12.332 this admission  - Personally discussed with patient and wife- plan to establish with PCP in a week for formal TFTs and appropriate management   Allergies No Known Allergies  Diagnostic Studies/Procedures  PA/LATERAL CHEST X-RAY - 06/26/13  IMPRESSION:  Mild interstitial prominence of uncertain chronicity.  CARDIAC CATHETERIZATION - 06/27/13  LV pressure: 108/16  Aortic pressure: 101/65  Angiography  Left Main: large, smooth and normal  Left anterior Descending: moderate sized, minor luminal irregularities proximal  Left Circumflex: moderate sized vessel, Minor luminal irreg  Right Coronary Artery: large, 90% ulcerated lesion in the prox vessel.  LV Gram: normal LV function. EF 60%  Complications: No apparent complications  Patient did tolerate procedure well.  Contrast used: 60 cc  Conclusions:  1. Single vessel CAD - RCA.  2. Normal LV function.  PERCUTANEOUS CORONARY INTERVENTION - 06/27/13  PCI Data:  Vessel - Proximal RCA  /Segment - 1  Percent Stenosis (pre) 90%  TIMI-flow 3  Stent: 4.0 X 24 mm Veriflex bare metal stent  Percent Stenosis (post) 0 %  TIMI-flow (post) 3   Final Conclusions:  Successful PCI and bare metal stent placement to proximal RCA.   History of Present Illness Vincent Henry is a 52 y.o. male who was admitted to [facility] on 06/26/13 with the above problem list.   He is a 52yo Caucasian male with no prior medical history. He is an active tobacco smoker. 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 He had an episode of NBNB emesis at aroudn 5:30. That evening, he had chest pressure with numbness/tingling in his right arm thus prompting his ED presentation.   He admitted to smoking 1.5 PPD. He has a brother with CAD requiring stents in his 4s, father with CAD requiring CABG in 8s.   There, EKG revealed sinus bradycardia, HR 40-50s, lateral TWIs, biphasic Ts in V4/V5. Initial trop-I WNL. He received a GI cocktail with some relief, but NTG SL alleviated the pain completely. CXR as above indicated no acute findings. Given the patient's cardiac risk factors and HPI concerning for ACS, the decision was made to admit the patient for monitoring and to pursue a diagnostic cardiac catheterization the following day.   Hospital Course   He was heparinized, started on a low-dose ASA and high dose statin. BB was deferred due to bradycardia. Nicotine patch was placed for NRT. Serial troponins  were trended and did return significantly elevated as above. He remained stable and asymptomatic. Lipid panel did indicate elevated LDL as above. TSH did return markedly elevated.   He was informed, consented and prepped for cardiac catheterization the following morning which was accessed via the R radial artery. As above, this did reveal a 90% ulcerated prox RCA plaque identified as the culprit lesion. PCI  was performed after angiography by Dr. Kirke Corin. The patient received a BMS to the lesion with 0% post PCI stenosis. He tolerated the procedure well without complications. The recommendation was made to continue DAPT- ASA/Effient x at least 1 month.   He was observed overnight. There were no events. He remained asymptomatic. He ambulated well with cardiac rehabilitation and received formal smoking cessation counseling. He elected to quit without assistance. He was evaluated by Dr. Kirke Corin this morning and deemed stable for discharge. He will be discharged on the medication regimen outlined below. Effient assistance has been arranged. He will follow-up in 7-14 as part of transitional care management. The office will call the patient with the appointment date and time. He has been advised to establish with a primary care provider for evidence of likely hypothyroidism this admission. This was personally discussed with his wife and him. They plan to schedule this within a week's time. This information, including post-cath instructions and activity restrictions, has been clearly outlined in the discharge AVS.   Discharge Vitals:  Blood pressure 143/84, pulse 63, temperature 97.8 F (36.6 C), temperature source Oral, resp. rate 20, height 5\' 9"  (1.753 m), weight 90.6 kg (199 lb 11.8 oz), SpO2 96.00%.   Weight change: 1.695 kg (3 lb 11.8 oz)  Labs: Recent Labs     06/27/13  0810  06/28/13  0511  WBC  9.4  8.8  HGB  14.2  14.3  HCT  41.6  40.4  MCV  87.4  87.1  PLT  181  183    Recent Labs Lab 06/26/13 1853 06/27/13 0810 06/28/13 0511  NA 141 141 141  K 4.1 4.2 4.0  CL 107 110 111  CO2 23 22 23   BUN 13 14 12   CREATININE 1.17 1.15 1.25  CALCIUM 9.6 8.9 9.0  GLUCOSE 118* 94 93   Recent Labs     06/27/13  0147  06/27/13  0810  06/27/13  2300  TROPONINI  <0.30  4.45*  2.79*   Recent Labs     06/27/13  0810  CHOL  151  HDL  32*  LDLCALC  101*  TRIG  91  CHOLHDL  4.7    Recent  Labs  40/98/11 0147  TSH 12.332*   Disposition:  Discharge Orders   Future Orders Complete By Expires     Amb Referral to Cardiac Rehabilitation  As directed     Diet - low sodium heart healthy  As directed     Increase activity slowly  As directed           Follow-up Information   Follow up with No PCP Per Patient. Schedule an appointment as soon as possible for a visit in 1 week. (Please establish and follow-up with a primary care provider. )    Contact information:   [address] [place] [address] [phone]       Follow up with [facility]. (Office will call you with an appointment date and time. )    Contact information:   [address] [place] [address]  Discharge Medications:    Medication List         aspirin 81 MG EC tablet  Take 1 tablet (81 mg total) by mouth daily.     atorvastatin 80 MG tablet  Commonly known as:  LIPITOR  Take 1 tablet (80 mg total) by mouth daily at 6 PM.     nitroGLYCERIN 0.4 MG SL tablet  Commonly known as:  NITROSTAT  Place 1 tablet (0.4 mg total) under the tongue every 5 (five) minutes as needed for chest pain.     prasugrel 10 MG Tabs  Commonly known as:  EFFIENT  Take 1 tablet (10 mg total) by mouth daily.       Outstanding Labs/Studies: LFTs, lipid panel in 6 weeks. TFTs upon establishing with PCP.   Duration of Discharge Encounter: Greater than 30 minutes including physician time.  Signed, R. Hurman Horn, PA-C 06/28/2013, 1:47 PM

## 2013-06-29 ENCOUNTER — Telehealth: Payer: Self-pay | Admitting: Physician Assistant

## 2013-06-29 NOTE — Telephone Encounter (Signed)
New problem    Pt scheduled 7day TCM per after hour vm with Scott 07/03/13.

## 2013-06-29 NOTE — Telephone Encounter (Signed)
Patient contacted regarding discharge from [facility] on 06/28/13.  Patient understands to follow up with provider Brynda Rim on 07/03/13 at 2:20pm at [facility]. Patient understands discharge instructions? Yes Patient understands medications and regiment? Yes Patient understands to bring all medications to this visit? Yes  Patient states he is feeling well today; states he is taking it easy and denies questions or concerns.  Patient advised to call if he has any problems or concerns prior to his appointment.

## 2013-07-03 ENCOUNTER — Ambulatory Visit (INDEPENDENT_AMBULATORY_CARE_PROVIDER_SITE_OTHER): Payer: 59 | Admitting: Physician Assistant

## 2013-07-03 ENCOUNTER — Encounter: Payer: Self-pay | Admitting: Physician Assistant

## 2013-07-03 VITALS — BP 130/78 | HR 67 | Ht 69.0 in | Wt 197.0 lb

## 2013-07-03 DIAGNOSIS — R946 Abnormal results of thyroid function studies: Secondary | ICD-10-CM

## 2013-07-03 DIAGNOSIS — I214 Non-ST elevation (NSTEMI) myocardial infarction: Secondary | ICD-10-CM

## 2013-07-03 DIAGNOSIS — I2510 Atherosclerotic heart disease of native coronary artery without angina pectoris: Secondary | ICD-10-CM

## 2013-07-03 DIAGNOSIS — Z720 Tobacco use: Secondary | ICD-10-CM

## 2013-07-03 DIAGNOSIS — R7989 Other specified abnormal findings of blood chemistry: Secondary | ICD-10-CM

## 2013-07-03 DIAGNOSIS — E785 Hyperlipidemia, unspecified: Secondary | ICD-10-CM

## 2013-07-03 NOTE — Patient Instructions (Addendum)
You have been referred to [facility] TO EST. WITH PRIMARY CARE DX ELEVATED TSH  LAB TOMORROW 07/04/13 TSH, FREE T3, FREE T4  FASTING LIPID AND LIVER PANEL TO BE DONE IN 6 WEEKS  PLEASE FOLLOW UP WITH DR. Shirlee Latch IN 6 WEEKS

## 2013-07-03 NOTE — Progress Notes (Addendum)
[address] [place], [place]  [address] Phone: [phone] Fax:  [phone]  Date:  07/03/2013   ID:  Vincent Henry, DOB [birth year], MRN [id number]  PCP:  No PCP Per Patient  Cardiologist:  Dr. Marca Ancona     History of Present Illness: Vincent Henry is a 52 y.o. male who returns for f/u after a recent admission to the hospital for a NSTEMI.  He has a hx of tobacco abuse and family history of CAD. He presented to the hospital with complaints of chest discomfort. He had lateral T-wave inversions on ECG and cardiac markers were abnormal ruling in for non-STEMI. Of note, patient has baseline bradycardia and he was not placed on beta blocker therapy. LHC 06/27/13: Proximal RCA 90% ulcerated plaque, no sig disease in LAD and CFX, EF 60%. PCI: Veriflex BMS to the proximal RCA.  Dual antiplatelet therapy recommended for a minimum of one month; ideally 12 months.  TSH was noted to be elevated at discharge (12.332).  Post PCI course uneventful.   Patient is doing well. He denies chest pain, shortness of breath, syncope, orthopnea, PND or edema. He is compliant with all medications. Effient will be too expensive after he completes his first 30 day free trial.  Labs (7/14):  K 4, Cr 1.25, LDL 101, Hgb 14.3, TSH 12.332   Wt Readings from Last 3 Encounters:  06/28/13 199 lb 11.8 oz (90.6 kg)  06/28/13 199 lb 11.8 oz (90.6 kg)     Past Medical History  Diagnosis Date  . CAD (coronary artery disease) 06/27/2013    NSTEMI s/p BMS-RCA  . Hyperlipidemia   . Tobacco abuse   . Sinus bradycardia     HR 40-50s, asymptomatic  . Elevated TSH     12.332 on 06/27/13    Current Outpatient Prescriptions  Medication Sig Dispense Refill  . aspirin EC 81 MG EC tablet Take 1 tablet (81 mg total) by mouth daily.      Marland Kitchen atorvastatin (LIPITOR) 80 MG tablet Take 1 tablet (80 mg total) by mouth daily at 6 PM.  30 tablet  3  . nitroGLYCERIN (NITROSTAT) 0.4 MG SL tablet Place 1 tablet (0.4 mg  total) under the tongue every 5 (five) minutes as needed for chest pain.  25 tablet  3  . prasugrel (EFFIENT) 10 MG TABS Take 1 tablet (10 mg total) by mouth daily.  30 tablet  3   No current facility-administered medications for this visit.    Allergies:   No Known Allergies  Social History:  The patient  reports that he has quit smoking. His smoking use included Cigarettes. He has a 30 pack-year smoking history. He does not have any smokeless tobacco history on file. He reports that he does not drink alcohol.   ROS:  Please see the history of present illness.      All other systems reviewed and negative.   PHYSICAL EXAM: VS:  BP 130/78  Pulse 67  Ht 5\' 9"  (1.753 m)  Wt 197 lb (89.359 kg)  BMI 29.08 kg/m2 Well nourished, well developed, in no acute distress HEENT: normal Neck: no JVD Cardiac:  normal S1, S2; RRR; no murmur Lungs:  clear to auscultation bilaterally, no wheezing, rhonchi or rales Abd: soft, nontender, no hepatomegaly Ext: no edema; right wrist without hematoma or mass  Skin: warm and dry Neuro:  CNs 2-12 intact, no focal abnormalities noted  EKG:  NSR, HR 67, nonspecific ST-T wave changes  ASSESSMENT AND PLAN:  1. CAD:  Doing well after recent non-STEMI treated with a bare-metal stent the RCA. LV function was normal. He could not tolerate beta blocker due to bradycardia. He is currently on aspirin and Effient.  Patient cannot afford Effient after his first 30 days (free trial). I will review with Dr. Kirke Corin to see about transitioning him over to Plavix after 30 days. Patient plans to start cardiac rehabilitation. 2. Hyperlipidemia:  Continue statin. Check lipids and LFTs in 6 weeks. 3. Elevated TSH:  Repeat TSH today with free T4 and free T3. Refer to primary care. 4. Tobacco Abuse:  He has quit smoking. 5. Disposition:  Follow up with Dr. Shirlee Latch in 6 weeks.  Signed, Tereso Newcomer, PA-C  07/03/2013 2:22 PM    Addendum 07/07/2013 5:06 PM: Reviewed with Drs.  Kirke Corin and Noma. Ok to switch to Plavix after 30 days of Effient. Dr. Kirke Corin suggested Plavix 150 mg on day 1. Signed,  Tereso Newcomer, PA-C   07/07/2013 5:07 PM

## 2013-07-04 ENCOUNTER — Other Ambulatory Visit: Payer: 59

## 2013-07-05 ENCOUNTER — Telehealth: Payer: Self-pay | Admitting: *Deleted

## 2013-07-05 NOTE — Telephone Encounter (Signed)
[name], [name] - 07/03/13 ','<<< Less Detail       Laurey Morale, MD       Sent: Mon July 03, 2013 10:41 PM    To: Beatrice Lecher, PA-C    Cc: Jacqlyn Krauss, RN                   Message     He can transition to Plavix 75 mg daily after 30 days if he got BMS and cannot get Effient. Stop Effient one day and start Plavix the next.         ----- Message -----    From: Beatrice Lecher, PA-C    Sent: 07/03/2013 5:45 PM    To: Laurey Morale, MD    Vincent Henry - 07/03/13 ','<<< Less Detail       Laurey Morale, MD       Sent: Mon July 03, 2013 10:41 PM    To: Beatrice Lecher, PA-C    Cc: Jacqlyn Krauss, RN                   Message     He can transition to Plavix 75 mg daily after 30 days if he got BMS and cannot get Effient. Stop Effient one day and start Plavix the next.         ----- Message -----    From: Beatrice Lecher, PA-C    Sent: 07/03/2013 5:45 PM    To: Laurey Morale, MD    07/05/13--pt advised, verbalized understanding. Pt is going to call several pharmacies and price check. He will call me back once he decides where he wants the prescription sent.

## 2013-07-07 ENCOUNTER — Telehealth: Payer: Self-pay | Admitting: *Deleted

## 2013-07-07 MED ORDER — CLOPIDOGREL BISULFATE 75 MG PO TABS: ORAL_TABLET | ORAL | Status: DC

## 2013-07-07 NOTE — Telephone Encounter (Signed)
pt notified about ok switching to plavix when finished with effient. rx sent to [facility] per pt request, pt verbalized understanding to plavix instructions

## 2013-07-17 ENCOUNTER — Ambulatory Visit: Payer: No Typology Code available for payment source | Attending: Family Medicine | Admitting: Family Medicine

## 2013-07-17 VITALS — BP 111/74 | HR 72 | Temp 98.3°F | Ht 68.0 in | Wt 196.6 lb

## 2013-07-17 DIAGNOSIS — I214 Non-ST elevation (NSTEMI) myocardial infarction: Secondary | ICD-10-CM

## 2013-07-17 DIAGNOSIS — I498 Other specified cardiac arrhythmias: Secondary | ICD-10-CM

## 2013-07-17 DIAGNOSIS — I2510 Atherosclerotic heart disease of native coronary artery without angina pectoris: Secondary | ICD-10-CM | POA: Insufficient documentation

## 2013-07-17 DIAGNOSIS — R7989 Other specified abnormal findings of blood chemistry: Secondary | ICD-10-CM

## 2013-07-17 DIAGNOSIS — R946 Abnormal results of thyroid function studies: Secondary | ICD-10-CM

## 2013-07-17 DIAGNOSIS — R001 Bradycardia, unspecified: Secondary | ICD-10-CM

## 2013-07-17 DIAGNOSIS — E785 Hyperlipidemia, unspecified: Secondary | ICD-10-CM | POA: Insufficient documentation

## 2013-07-17 MED ORDER — LORATADINE 10 MG PO TABS: 10.0000 mg | ORAL_TABLET | Freq: Every day | ORAL | Status: DC

## 2013-07-17 MED ORDER — DEXTROMETHORPHAN POLISTIREX 30 MG/5ML PO LQCR: 60.0000 mg | Freq: Two times a day (BID) | ORAL | Status: DC | PRN

## 2013-07-17 NOTE — Patient Instructions (Addendum)
Coronary Artery Disease, Risk Factors  Research has shown that the risk of developing coronary artery disease (CAD) and having a heart attack increases with each factor you have.  RISK FACTORS YOU CANNOT CHANGE   Your age. Your risk goes up as you get older. Most heart attacks happen to people over the age of 65.   Gender. Men have a greater risk of heart attack than women, and they have attacks earlier in life. However, women are more likely to die from a heart attack.   Heredity. Children of parents with heart disease are more likely to develop it themselves.   Race. African Americans and other ethnic groups have a higher risk, possibly because of high blood pressure, a tendency toward obesity, and diabetes.   Your family. Most people with a strong family history of heart disease have one or more other risk factors.  RISK FACTORS YOU CAN CHANGE   Exposure to tobacco smoke. Even secondhand smoke greatly increases the risk for heart disease.   High blood cholesterol may be lowered with changes in diet, activity, and medicines.   High blood pressure makes the heart work harder. This causes the heart muscles to become thick and, eventually, weaker. It also increases your risk of stroke, heart attack, and kidney or heart failure.   Physical inactivity is a risk factor for CAD. Regular physical activity helps prevent heart and blood vessel disease. Exercise helps control blood cholesterol, diabetes, obesity, and it may help lower blood pressure in some people.   Excess body fat, especially belly fat, increases the risk of heart disease and stroke even if there are no other risk factors. Excess weight increases the heart's workload and raises blood pressure and blood cholesterol.   Diabetes seriously increases your risk of developing CAD. If you have diabetes, you should work with your caregiver to manage it and control other risk factors.  OTHER RISK FACTORS FOR CAD   How you respond to stress.    Drinking too much alcohol may raise blood pressure, cause heart failure, and lead to stroke.   Total cholesterol greater than 200 milligrams.   HDL (good) cholesterol less than 40 milligrams. HDL helps keep cholesterol from building up in the walls of the arteries.  PREVENTING CAD   Maintain a healthy weight.   Exercise or do physical activity.   Eat a heart-healthy diet low in fat and salt and high in fiber.   Control your blood pressure to keep it below 120 over 80.   Keep your cholesterol at a level that lowers your risk.   Manage diabetes if you have it.   Stop smoking.   Learn how to manage stress.  HEART SMART SUBSTITUTIONS   Instead of whole or 2% milk and cream, use skim milk.   Instead of fried foods, eat baked, steamed, boiled, broiled, or microwaved foods.   Instead of lard, butter, palm and coconut oils, cook with unsaturated vegetable oils, such as corn, olive, canola, safflower, sesame, soybean, sunflower, or peanut.   Instead of fatty cuts of meat, eat lean cuts of meat or cut off the fatty parts.   Instead of 1 whole egg in recipes, use 2 egg whites.   Instead of sauces, butter, and salt, season vegetables with herbs and spices.   Instead of regular hard and processed cheeses, eat low-fat, low-sodium cheeses.   Instead of salted potato chips, choose low-fat, unsalted tortilla and potato chips and unsalted pretzels and popcorn.   Instead of   sour cream and mayonnaise, use plain low-fat yogurt, low-fat cottage cheese, or low-fat or "light" sour cream.  FOR MORE INFORMATION   National Heart Lung and Blood Institute: www.nhlbi.nih.gov/health/hearttruth  American Heart Association: www.heart.org/HEARTORG  Document Released: 02/20/2004 Document Revised: 02/22/2012 Document Reviewed: 02/15/2008  ExitCare Patient Information 2014 [employer].

## 2013-07-17 NOTE — Progress Notes (Signed)
Patient ID: Vincent Henry, male   DOB: 1961/03/26, 52 y.o.   MRN: [id number]  CC:  Hospital follow up   HPI: Pt is getting out of hospital for an MI.   Pt says that he is feeling better.  Pt has a cardiologist.  Patient reports that he had a cardiac stent placed for 90% blockage on the right side.  The patient reports that he's no longer having chest pain or shortness of breath.  He reports that he is now currently taking Plavix.  The patient reports that he has been coughing for the past several days.  He's also been sneezing.  He denies wheezing and shortness of breath.  The patient also reports that he has a very strong family history of coronary artery disease.  No Known Allergies Past Medical History  Diagnosis Date  . CAD (coronary artery disease) 06/27/2013    NSTEMI s/p BMS-RCA  . Hyperlipidemia   . Tobacco abuse   . Sinus bradycardia     HR 40-50s, asymptomatic  . Elevated TSH     12.332 on 06/27/13   Current Outpatient Prescriptions on File Prior to Visit  Medication Sig Dispense Refill  . aspirin EC 81 MG EC tablet Take 1 tablet (81 mg total) by mouth daily.      Marland Kitchen atorvastatin (LIPITOR) 80 MG tablet Take 1 tablet (80 mg total) by mouth daily at 6 PM.  30 tablet  3  . clopidogrel (PLAVIX) 75 MG tablet Day one take 2 tabs to = 150 mg; starting day 2 take 1 tab daily  90 tablet  3  . nitroGLYCERIN (NITROSTAT) 0.4 MG SL tablet Place 1 tablet (0.4 mg total) under the tongue every 5 (five) minutes as needed for chest pain.  25 tablet  3  . prasugrel (EFFIENT) 10 MG TABS Take 1 tablet (10 mg total) by mouth daily.  30 tablet  3   No current facility-administered medications on file prior to visit.   Family History  Problem Relation Age of Onset  . CAD Brother     "Stents in his 59s"  . CAD Father     CABG in his 63s   History   Social History  . Marital Status: Divorced    Spouse Name: N/A    Number of Children: N/A  . Years of Education: N/A   Occupational History  . Not  on file.   Social History Main Topics  . Smoking status: Former Smoker -- 1.50 packs/day for 20 years    Types: Cigarettes  . Smokeless tobacco: Not on file  . Alcohol Use: No  . Drug Use: Not on file  . Sexually Active: Not on file   Other Topics Concern  . Not on file   Social History Narrative  . No narrative on file    Review of Systems  Constitutional: Negative for fever, chills, diaphoresis, activity change, appetite change and fatigue.  HENT: Negative for ear pain, nosebleeds, congestion, facial swelling, rhinorrhea, neck pain, neck stiffness and ear discharge.   Eyes: Negative for pain, discharge, redness, itching and visual disturbance.  Respiratory: Negative for cough, choking, chest tightness, shortness of breath, wheezing and stridor.   Cardiovascular: Negative for chest pain, palpitations and leg swelling.  Gastrointestinal: Negative for abdominal distention.  Genitourinary: Negative for dysuria, urgency, frequency, hematuria, flank pain, decreased urine volume, difficulty urinating and dyspareunia.  Musculoskeletal: Negative for back pain, joint swelling, arthralgias and gait problem.  Neurological: Negative for dizziness, tremors, seizures, syncope,  facial asymmetry, speech difficulty, weakness, light-headedness, numbness and headaches.  Hematological: Negative for adenopathy. Does not bruise/bleed easily.  Psychiatric/Behavioral: Negative for hallucinations, behavioral problems, confusion, dysphoric mood, decreased concentration and agitation.    Objective:   Filed Vitals:   07/17/13 1523  BP: 111/74  Pulse: 72  Temp: 98.3 F (36.8 C)    Physical Exam  Constitutional: Appears well-developed and well-nourished. No distress.  HENT: Normocephalic. External right and left ear normal. Oropharynx is clear and moist.  Eyes: Conjunctivae and EOM are normal. PERRLA, no scleral icterus.  Neck: Normal ROM. Neck supple. No JVD. No tracheal deviation. No thyromegaly.   CVS: RRR, S1/S2 +, no murmurs, no gallops, no carotid bruit.  Pulmonary: Effort and breath sounds normal, no stridor, rhonchi, wheezes, rales.  Abdominal: Soft. BS +,  no distension, tenderness, rebound or guarding.  Musculoskeletal: Normal range of motion. No edema and no tenderness.  Lymphadenopathy: No lymphadenopathy noted, cervical, inguinal. Neuro: Alert. Normal reflexes, muscle tone coordination. No cranial nerve deficit. Skin: Skin is warm and dry. No rash noted. Not diaphoretic. No erythema. No pallor.  Psychiatric: Normal mood and affect. Behavior, judgment, thought content normal.   Lab Results  Component Value Date   WBC 8.8 06/28/2013   HGB 14.3 06/28/2013   HCT 40.4 06/28/2013   MCV 87.1 06/28/2013   PLT 183 06/28/2013   Lab Results  Component Value Date   CREATININE 1.25 06/28/2013   BUN 12 06/28/2013   NA 141 06/28/2013   K 4.0 06/28/2013   CL 111 06/28/2013   CO2 23 06/28/2013   No results found for this basename: HGBA1C   Lipid Panel     Component Value Date/Time   CHOL 151 06/27/2013 0810   TRIG 91 06/27/2013 0810   HDL 32* 06/27/2013 0810   CHOLHDL 4.7 06/27/2013 0810   VLDL 18 06/27/2013 0810   LDLCALC 101* 06/27/2013 0810     Assessment and plan:   Patient Active Problem List   Diagnosis Date Noted  . NSTEMI (non-ST elevated myocardial infarction) 06/28/2013  . CAD (coronary artery disease), native coronary artery 06/28/2013  . Tobacco abuse 06/28/2013  . Hyperlipidemia 06/28/2013  . Sinus bradycardia 06/28/2013  . Elevated TSH 06/28/2013   CAD (coronary artery disease), native coronary artery  Elevated TSH  Hyperlipidemia  NSTEMI (non-ST elevated myocardial infarction)  Sinus bradycardia  Reviewed hospital records with patient today  Continue current medications   Continue to encourage tobacco cessation  RTC in 6 weeks  The patient was given clear instructions to go to ER or return to medical center if symptoms don't improve, worsen or new  problems develop.  The patient verbalized understanding.  The patient was told to call to get any lab results if not heard anything in the next week.    Rodney Langton, MD, CDE, FAAFP [facility] [place], [place]

## 2013-07-24 ENCOUNTER — Ambulatory Visit: Payer: Self-pay | Attending: Family Medicine

## 2013-08-03 ENCOUNTER — Encounter: Payer: Self-pay | Admitting: Pharmacist

## 2013-08-03 ENCOUNTER — Ambulatory Visit (INDEPENDENT_AMBULATORY_CARE_PROVIDER_SITE_OTHER): Payer: No Typology Code available for payment source | Admitting: Pharmacist

## 2013-08-03 VITALS — BP 130/88 | HR 70 | Ht 69.0 in | Wt 197.3 lb

## 2013-08-03 DIAGNOSIS — F17200 Nicotine dependence, unspecified, uncomplicated: Secondary | ICD-10-CM

## 2013-08-03 DIAGNOSIS — Z720 Tobacco use: Secondary | ICD-10-CM

## 2013-08-03 NOTE — Assessment & Plan Note (Signed)
Spirometry evaluation reveals near normal lung function.    Reviewed results of pulmonary function tests.  Pt verbalized understanding of results and education.  Patient has been experiencing coughing for months and taking dextromethorphan.   He has recently quit smoking following hospitalization for ACS.  Encouraged continued tobacco cessation and discussed potential sources of tobacco relapse.   no change in treatment plan at this time reinforced need for adherence with dual antiplatelet therapy and continuing use until discontinued by one of his providers.   Written pt instructions provided.  F/U Clinic visit [facility].   Total time in face to face counseling 30 minutes.  Patient seen with Georga Kaufmann, PharmD Resident.

## 2013-08-03 NOTE — Progress Notes (Signed)
S:    Patient arrives in good spirits and ambulates with ease. He presents to the clinic for lung function evaluation. Patient reports a cough.  He also notes increase in ease of bruising and bleeding time since the start of the "blood thinners" in the hospital.    O: See "scanned report" or Documentation Flowsheet (discrete results - PFTs) for  Spirometry results. Patient provided good effort while attempting spirometry.   Mild bruising noted on forearms bilaterally.   A/P: Spirometry evaluation reveals near normal lung function.    Reviewed results of pulmonary function tests.  Pt verbalized understanding of results and education.  Patient has been experiencing coughing for months and taking dextromethorphan.   He has recently quit smoking following hospitalization for ACS.  Encouraged continued tobacco cessation and discussed potential sources of tobacco relapse.   no change in treatment plan at this time reinforced need for adherence with dual antiplatelet therapy and continuing use until discontinued by one of his providers.   Written pt instructions provided.  F/U Clinic visit [facility].   Total time in face to face counseling 30 minutes.  Patient seen with Georga Kaufmann, PharmD Resident.

## 2013-08-03 NOTE — Patient Instructions (Addendum)
Thanks for coming in today!  Congratulations of not smoking! Keep up the good work!  Your spirometry (lung) test was normal.  Please follow up with with Dr. Laural Benes.

## 2013-08-03 NOTE — Progress Notes (Signed)
Patient ID: Vincent Henry, male   DOB: [birth year], 52 y.o.   MRN: [id number] Reviewed: Agree with Dr. Macky Lower documentation and management.

## 2013-08-15 ENCOUNTER — Other Ambulatory Visit (INDEPENDENT_AMBULATORY_CARE_PROVIDER_SITE_OTHER): Payer: No Typology Code available for payment source

## 2013-08-15 DIAGNOSIS — R946 Abnormal results of thyroid function studies: Secondary | ICD-10-CM

## 2013-08-15 DIAGNOSIS — E785 Hyperlipidemia, unspecified: Secondary | ICD-10-CM

## 2013-08-15 DIAGNOSIS — R7989 Other specified abnormal findings of blood chemistry: Secondary | ICD-10-CM

## 2013-08-15 LAB — T3, FREE: T3, Free: 3.3 pg/mL (ref 2.3–4.2)

## 2013-08-15 LAB — T4, FREE: Free T4: 0.95 ng/dL (ref 0.60–1.60)

## 2013-08-15 LAB — HEPATIC FUNCTION PANEL
AST: 23 U/L (ref 0–37)
Alkaline Phosphatase: 86 U/L (ref 39–117)
Total Bilirubin: 0.6 mg/dL (ref 0.3–1.2)

## 2013-08-15 LAB — LIPID PANEL
Cholesterol: 101 mg/dL (ref 0–200)
LDL Cholesterol: 54 mg/dL (ref 0–99)
VLDL: 14.2 mg/dL (ref 0.0–40.0)

## 2013-08-16 ENCOUNTER — Telehealth: Payer: Self-pay | Admitting: *Deleted

## 2013-08-16 NOTE — Telephone Encounter (Signed)
lmom labs ok, no changes to be made 

## 2013-08-28 ENCOUNTER — Encounter: Payer: Self-pay | Admitting: Internal Medicine

## 2013-08-28 ENCOUNTER — Ambulatory Visit: Payer: No Typology Code available for payment source | Attending: Internal Medicine | Admitting: Internal Medicine

## 2013-08-28 VITALS — BP 129/79 | HR 83 | Temp 98.9°F | Resp 16 | Ht 69.29 in | Wt 197.0 lb

## 2013-08-28 DIAGNOSIS — R7989 Other specified abnormal findings of blood chemistry: Secondary | ICD-10-CM

## 2013-08-28 DIAGNOSIS — I2510 Atherosclerotic heart disease of native coronary artery without angina pectoris: Secondary | ICD-10-CM | POA: Insufficient documentation

## 2013-08-28 DIAGNOSIS — R946 Abnormal results of thyroid function studies: Secondary | ICD-10-CM

## 2013-08-28 DIAGNOSIS — I10 Essential (primary) hypertension: Secondary | ICD-10-CM

## 2013-08-28 DIAGNOSIS — E785 Hyperlipidemia, unspecified: Secondary | ICD-10-CM | POA: Insufficient documentation

## 2013-08-28 LAB — POCT GLYCOSYLATED HEMOGLOBIN (HGB A1C): Hemoglobin A1C: 5.4

## 2013-08-28 MED ORDER — CLOPIDOGREL BISULFATE 75 MG PO TABS: ORAL_TABLET | ORAL | Status: DC

## 2013-08-28 MED ORDER — ASPIRIN 81 MG PO TBEC: 81.0000 mg | DELAYED_RELEASE_TABLET | Freq: Every day | ORAL | Status: AC

## 2013-08-28 MED ORDER — NITROGLYCERIN 0.4 MG SL SUBL: 0.4000 mg | SUBLINGUAL_TABLET | SUBLINGUAL | Status: AC | PRN

## 2013-08-28 MED ORDER — LORATADINE 10 MG PO TABS: 10.0000 mg | ORAL_TABLET | Freq: Every day | ORAL | Status: DC

## 2013-08-28 MED ORDER — PRASUGREL HCL 10 MG PO TABS: 10.0000 mg | ORAL_TABLET | Freq: Every day | ORAL | Status: DC

## 2013-08-28 MED ORDER — ATORVASTATIN CALCIUM 80 MG PO TABS: 80.0000 mg | ORAL_TABLET | Freq: Every day | ORAL | Status: DC

## 2013-08-28 NOTE — Progress Notes (Signed)
Pt is here for a F/U visit. Pt had a heart attack in July and on July 15 pt had a stent put in. Pt reports that he is doing great and having no problems.

## 2013-08-28 NOTE — Progress Notes (Signed)
Patient ID: Vincent Henry, male   DOB: 01/31/1961, 52 y.o.   MRN: [id number]  CC: Followup  HPI: 52 year old male with past medical history of coronary artery disease and NSTEMI status post bare-metal stent placement on aspirin and Plavix who comes in for followup. Patient has no current complaints. No chest pain or shortness of breath. Patient tolerates medications very well. No reports of blood in the stool or urine. No dizziness. No abdominal pain, nausea or vomiting.  No Known Allergies Past Medical History  Diagnosis Date  . CAD (coronary artery disease) 06/27/2013    NSTEMI s/p BMS-RCA  . Hyperlipidemia   . Tobacco abuse   . Sinus bradycardia     HR 40-50s, asymptomatic  . Elevated TSH     12.332 on 06/27/13   No current outpatient prescriptions on file prior to visit.   No current facility-administered medications on file prior to visit.   Family History  Problem Relation Age of Onset  . CAD Brother     "Stents in his 45s"  . CAD Father     CABG in his 22s   History   Social History  . Marital Status: Divorced    Spouse Name: N/A    Number of Children: N/A  . Years of Education: N/A   Occupational History  . Not on file.   Social History Main Topics  . Smoking status: Former Smoker -- 1.00 packs/day for 34 years    Types: Cigarettes  . Smokeless tobacco: Former Neurosurgeon    Quit date: 06/26/2013  . Alcohol Use: No  . Drug Use: Not on file  . Sexual Activity: Not on file   Other Topics Concern  . Not on file   Social History Narrative  . No narrative on file    Review of Systems  Constitutional: Negative for fever, chills, diaphoresis, activity change, appetite change and fatigue.  HENT: Negative for ear pain, nosebleeds, congestion, facial swelling, rhinorrhea, neck pain, neck stiffness and ear discharge.   Eyes: Negative for pain, discharge, redness, itching and visual disturbance.  Respiratory: Negative for cough, choking, chest tightness, shortness of  breath, wheezing and stridor.   Cardiovascular: Negative for chest pain, palpitations and leg swelling.  Gastrointestinal: Negative for abdominal distention.  Genitourinary: Negative for dysuria, urgency, frequency, hematuria, flank pain, decreased urine volume, difficulty urinating and dyspareunia.  Musculoskeletal: Negative for back pain, joint swelling, arthralgias and gait problem.  Neurological: Negative for dizziness, tremors, seizures, syncope, facial asymmetry, speech difficulty, weakness, light-headedness, numbness and headaches.  Hematological: Negative for adenopathy. Does not bruise/bleed easily.  Psychiatric/Behavioral: Negative for hallucinations, behavioral problems, confusion, dysphoric mood, decreased concentration and agitation.    Objective:   Filed Vitals:   08/28/13 1420  BP: 129/79  Pulse: 83  Temp: 98.9 F (37.2 C)  Resp: 16    Physical Exam  Constitutional: Appears well-developed and well-nourished. No distress.  HENT: Normocephalic. External right and left ear normal. Oropharynx is clear and moist.  Eyes: Conjunctivae and EOM are normal. PERRLA, no scleral icterus.  Neck: Normal ROM. Neck supple. No JVD. No tracheal deviation. No thyromegaly.  CVS: RRR, S1/S2 +, no murmurs, no gallops, no carotid bruit.  Pulmonary: Effort and breath sounds normal, no stridor, rhonchi, wheezes, rales.  Abdominal: Soft. BS +,  no distension, tenderness, rebound or guarding.  Musculoskeletal: Normal range of motion. No edema and no tenderness.  Lymphadenopathy: No lymphadenopathy noted, cervical, inguinal. Neuro: Alert. Normal reflexes, muscle tone coordination. No cranial nerve deficit. Skin: Skin  is warm and dry. No rash noted. Not diaphoretic. No erythema. No pallor.  Psychiatric: Normal mood and affect. Behavior, judgment, thought content normal.   Lab Results  Component Value Date   WBC 8.8 06/28/2013   HGB 14.3 06/28/2013   HCT 40.4 06/28/2013   MCV 87.1 06/28/2013    PLT 183 06/28/2013   Lab Results  Component Value Date   CREATININE 1.25 06/28/2013   BUN 12 06/28/2013   NA 141 06/28/2013   K 4.0 06/28/2013   CL 111 06/28/2013   CO2 23 06/28/2013    No results found for this basename: HGBA1C   Lipid Panel     Component Value Date/Time   CHOL 101 08/15/2013 0907   TRIG 71.0 08/15/2013 0907   HDL 32.90* 08/15/2013 0907   CHOLHDL 3 08/15/2013 0907   VLDL 14.2 08/15/2013 0907   LDLCALC 54 08/15/2013 0907       Assessment and plan:   Patient Active Problem List   Diagnosis Date Noted  . CAD (coronary artery disease), native coronary artery 06/28/2013    Priority: Medium - Continue aspirin and Plavix  - Continue statin therapy  - Followup with cardiology per scheduled appointment   . Hyperlipidemia 06/28/2013    Priority: Medium - Continue Lipitor 80 mg daily   . Elevated TSH 06/28/2013    Priority: Medium - Check TSH today

## 2013-08-28 NOTE — Patient Instructions (Addendum)
Coronary Artery Disease, Risk Factors  Research has shown that the risk of developing coronary artery disease (CAD) and having a heart attack increases with each factor you have.  RISK FACTORS YOU CANNOT CHANGE   Your age. Your risk goes up as you get older. Most heart attacks happen to people over the age of 65.   Gender. Men have a greater risk of heart attack than women, and they have attacks earlier in life. However, women are more likely to die from a heart attack.   Heredity. Children of parents with heart disease are more likely to develop it themselves.   Race. African Americans and other ethnic groups have a higher risk, possibly because of high blood pressure, a tendency toward obesity, and diabetes.   Your family. Most people with a strong family history of heart disease have one or more other risk factors.  RISK FACTORS YOU CAN CHANGE   Exposure to tobacco smoke. Even secondhand smoke greatly increases the risk for heart disease.   High blood cholesterol may be lowered with changes in diet, activity, and medicines.   High blood pressure makes the heart work harder. This causes the heart muscles to become thick and, eventually, weaker. It also increases your risk of stroke, heart attack, and kidney or heart failure.   Physical inactivity is a risk factor for CAD. Regular physical activity helps prevent heart and blood vessel disease. Exercise helps control blood cholesterol, diabetes, obesity, and it may help lower blood pressure in some people.   Excess body fat, especially belly fat, increases the risk of heart disease and stroke even if there are no other risk factors. Excess weight increases the heart's workload and raises blood pressure and blood cholesterol.   Diabetes seriously increases your risk of developing CAD. If you have diabetes, you should work with your caregiver to manage it and control other risk factors.  OTHER RISK FACTORS FOR CAD   How you respond to stress.    Drinking too much alcohol may raise blood pressure, cause heart failure, and lead to stroke.   Total cholesterol greater than 200 milligrams.   HDL (good) cholesterol less than 40 milligrams. HDL helps keep cholesterol from building up in the walls of the arteries.  PREVENTING CAD   Maintain a healthy weight.   Exercise or do physical activity.   Eat a heart-healthy diet low in fat and salt and high in fiber.   Control your blood pressure to keep it below 120 over 80.   Keep your cholesterol at a level that lowers your risk.   Manage diabetes if you have it.   Stop smoking.   Learn how to manage stress.  HEART SMART SUBSTITUTIONS   Instead of whole or 2% milk and cream, use skim milk.   Instead of fried foods, eat baked, steamed, boiled, broiled, or microwaved foods.   Instead of lard, butter, palm and coconut oils, cook with unsaturated vegetable oils, such as corn, olive, canola, safflower, sesame, soybean, sunflower, or peanut.   Instead of fatty cuts of meat, eat lean cuts of meat or cut off the fatty parts.   Instead of 1 whole egg in recipes, use 2 egg whites.   Instead of sauces, butter, and salt, season vegetables with herbs and spices.   Instead of regular hard and processed cheeses, eat low-fat, low-sodium cheeses.   Instead of salted potato chips, choose low-fat, unsalted tortilla and potato chips and unsalted pretzels and popcorn.   Instead of   sour cream and mayonnaise, use plain low-fat yogurt, low-fat cottage cheese, or low-fat or "light" sour cream.  FOR MORE INFORMATION   National Heart Lung and Blood Institute: www.nhlbi.nih.gov/health/hearttruth  American Heart Association: www.heart.org/HEARTORG  Document Released: 02/20/2004 Document Revised: 02/22/2012 Document Reviewed: 02/15/2008  ExitCare Patient Information 2014 [employer].

## 2013-08-29 LAB — TSH: TSH: 1.037 u[IU]/mL (ref 0.350–4.500)

## 2013-09-11 ENCOUNTER — Ambulatory Visit (INDEPENDENT_AMBULATORY_CARE_PROVIDER_SITE_OTHER): Payer: No Typology Code available for payment source | Admitting: Cardiology

## 2013-09-11 ENCOUNTER — Encounter: Payer: Self-pay | Admitting: Cardiology

## 2013-09-11 VITALS — BP 130/82 | HR 66 | Ht 69.25 in | Wt 196.8 lb

## 2013-09-11 DIAGNOSIS — Z720 Tobacco use: Secondary | ICD-10-CM

## 2013-09-11 DIAGNOSIS — F17200 Nicotine dependence, unspecified, uncomplicated: Secondary | ICD-10-CM

## 2013-09-11 DIAGNOSIS — I2510 Atherosclerotic heart disease of native coronary artery without angina pectoris: Secondary | ICD-10-CM

## 2013-09-11 DIAGNOSIS — I214 Non-ST elevation (NSTEMI) myocardial infarction: Secondary | ICD-10-CM

## 2013-09-11 DIAGNOSIS — E785 Hyperlipidemia, unspecified: Secondary | ICD-10-CM

## 2013-09-11 MED ORDER — RAMIPRIL 2.5 MG PO CAPS: 2.5000 mg | ORAL_CAPSULE | Freq: Every day | ORAL | Status: DC

## 2013-09-11 NOTE — Patient Instructions (Addendum)
Start ramipril 2.5mg  daily.   Your physician has requested that you have an echocardiogram. Echocardiography is a painless test that uses sound waves to create images of your heart. It provides your doctor with information about the size and shape of your heart and how well your heart's chambers and valves are working. This procedure takes approximately one hour. There are no restrictions for this procedure. In a week or so.   Your physician recommends that you return for lab work in: a week or so when you have the echocardiogram--BMET.   Your physician wants you to follow-up in: 6 months with Dr Shirlee Latch. ( March 2015).  You will receive a reminder letter in the mail two months in advance. If you don't receive a letter, please call our office to schedule the follow-up appointment.

## 2013-09-11 NOTE — Progress Notes (Signed)
[address] [place], [place]  [address] Phone: [phone] Fax:  [phone]  Date:  09/11/2013   ID:  Samyak Sackmann, DOB 1961/08/14, MRN [id number]  PCP:  Jeanann Lewandowsky, MD  Cardiologist:  Dr. Marca Ancona     History of Present Illness: Vincent Henry is a 53 y.o. male who returns for f/u after a recent admission to the hospital for a NSTEMI.  He has a hx of tobacco abuse and family history of CAD. He presented to the hospital with complaints of chest discomfort. He had lateral T-wave inversions on ECG and cardiac markers were abnormal ruling in for non-STEMI. Of note, patient has baseline bradycardia and he was not placed on beta blocker therapy. LHC 06/27/13: Proximal RCA 90% ulcerated plaque, no sig disease in LAD and CFX, EF 60%. PCI: Veriflex BMS to the proximal RCA.  Initially dual antiplatelet therapy recommended for a minimum of one month.  Transitioned from Eliquis to Plavix at initial hospital followup (2month).  Plan to continue Dual Therapy for ideally 12 months.  TSH was noted to be elevated during hospitalization but has since corrected.  Post PCI course uneventful.   Patient reports doing "great".  He denies chest pain, shortness of breath, syncope, orthopnea, PND or edema.  Has not used his NITRO.   Denies melana, hematachezia, or other abnormal bleeding.  No dysarthria or unilateral weakness.   He is compliant with all medications.  He has quit smoking.  Labs (7/14):  K 4, Cr 1.25, LDL 101, Hgb 14.3, TSH 12.332  Labs (9/15): TSH 1.037 (normal), A1c 5.4, LDL 54, HDL 33  Wt Readings from Last 3 Encounters:  09/11/13 196 lb 12.8 oz (89.268 kg)  08/28/13 197 lb (89.359 kg)  08/03/13 197 lb 4.8 oz (89.495 kg)    PMH: 1. CAD: NSTEMI (7/14) with 90% pRCA stenosis.  Had BMS to RCA.  2. Prior smoker 3. Hyperlipidemia 4. 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  Quit smoking in 7/14.   FH: No premature CAD  Current  Outpatient Prescriptions  Medication Sig Dispense Refill  . aspirin 81 MG EC tablet Take 1 tablet (81 mg total) by mouth daily.  30 tablet  3  . atorvastatin (LIPITOR) 80 MG tablet Take 1 tablet (80 mg total) by mouth daily at 6 PM.  30 tablet  3  . clopidogrel (PLAVIX) 75 MG tablet Day one take 2 tabs to = 150 mg; starting day 2 take 1 tab daily  30 tablet  3  . loratadine (CLARITIN) 10 MG tablet Take 10 mg by mouth daily as needed.      . nitroGLYCERIN (NITROSTAT) 0.4 MG SL tablet Place 1 tablet (0.4 mg total) under the tongue every 5 (five) minutes as needed for chest pain.  25 tablet  3   No current facility-administered medications for this visit.    Allergies:   No Known Allergies  ROS:  Please see the history of present illness.      All other systems reviewed and negative.   PHYSICAL EXAM: VS:  BP 130/82  Pulse 66  Ht 5' 9.25" (1.759 m)  Wt 196 lb 12.8 oz (89.268 kg)  BMI 28.85 kg/m2 Well nourished, well developed, in no acute distress HEENT: normal Neck: no JVD, no carotid bruits Cardiac:  normal S1, S2; RRR; no murmur Lungs:  clear to auscultation bilaterally, no wheezing, rhonchi or rales Abd: soft, nontender, no hepatomegaly Ext: no edema; right wrist  without hematoma or mass, distal pulses 1+/4 in B DP & PT Skin: warm and dry  EKG:  NSR, HR 66, borderline LVH criteria, likely pulmonary pattern.   ASSESSMENT AND PLAN:  1. CAD:  Doing well after recent non-STEMI treated with a bare-metal stent the RCA. On ASA/Plavix for 1 year.  ECHO today to re-evaluate.  Start Ramipril 2.5 mg for seconday prevention.  Pt continues to be highly active at home including 2+ miles of walking daily 2. Hyperlipidemia:  Continue high potency statin.  LDL at goal <70 3. Elevated TSH:  Now resolved, suspect sick euthyroid at time of NSTEMI.   4. Tobacco Abuse:  He has quit smoking.  Encouraged to stay quit 5. Disposition:  Start Ramipril,  BMET in 2 weeks. ECHO.  F/u in 6 months.      Andrena Mews, DO 10:19 AM 09/11/2013  Patient seen with resident, agree with the above note.  Doing well overall.  Will get echo to assess LV function post-MI and start ramipril for secondary prevention.    Marca Ancona 09/11/2013 1:36 PM

## 2013-09-22 ENCOUNTER — Other Ambulatory Visit (HOSPITAL_COMMUNITY): Payer: Self-pay | Admitting: Cardiology

## 2013-09-22 ENCOUNTER — Ambulatory Visit (HOSPITAL_COMMUNITY): Payer: No Typology Code available for payment source | Attending: Internal Medicine | Admitting: Radiology

## 2013-09-22 ENCOUNTER — Ambulatory Visit (HOSPITAL_BASED_OUTPATIENT_CLINIC_OR_DEPARTMENT_OTHER): Payer: No Typology Code available for payment source

## 2013-09-22 ENCOUNTER — Encounter (INDEPENDENT_AMBULATORY_CARE_PROVIDER_SITE_OTHER): Payer: Self-pay

## 2013-09-22 DIAGNOSIS — E785 Hyperlipidemia, unspecified: Secondary | ICD-10-CM | POA: Insufficient documentation

## 2013-09-22 DIAGNOSIS — I214 Non-ST elevation (NSTEMI) myocardial infarction: Secondary | ICD-10-CM

## 2013-09-22 DIAGNOSIS — I2510 Atherosclerotic heart disease of native coronary artery without angina pectoris: Secondary | ICD-10-CM

## 2013-09-22 DIAGNOSIS — R0989 Other specified symptoms and signs involving the circulatory and respiratory systems: Secondary | ICD-10-CM

## 2013-09-22 NOTE — Progress Notes (Signed)
Transthoracic Echocardiogram performed.

## 2013-10-05 ENCOUNTER — Other Ambulatory Visit: Payer: Self-pay | Admitting: Physician Assistant

## 2013-10-30 ENCOUNTER — Ambulatory Visit: Payer: No Typology Code available for payment source | Attending: Internal Medicine | Admitting: Internal Medicine

## 2013-10-30 VITALS — BP 113/73 | HR 76 | Temp 98.0°F | Resp 16 | Wt 196.0 lb

## 2013-10-30 DIAGNOSIS — I2510 Atherosclerotic heart disease of native coronary artery without angina pectoris: Secondary | ICD-10-CM

## 2013-10-30 DIAGNOSIS — I214 Non-ST elevation (NSTEMI) myocardial infarction: Secondary | ICD-10-CM

## 2013-10-30 DIAGNOSIS — E785 Hyperlipidemia, unspecified: Secondary | ICD-10-CM

## 2013-10-30 MED ORDER — NIACIN 100 MG PO TABS: 100.0000 mg | ORAL_TABLET | Freq: Every day | ORAL | Status: DC

## 2013-10-30 NOTE — Progress Notes (Unsigned)
Patient here for follow up CAD

## 2013-10-30 NOTE — Progress Notes (Unsigned)
Patient ID: Vincent Henry, male   DOB: May 28, 1961, 52 y.o.   MRN: [id number]  Patient Demographics  Vincent Henry, is a 52 y.o. male  WUJ:[id number]  [id number]:[id number]  DOB - 19-May-1961  Chief Complaint  Patient presents with  . Follow-up  . Coronary Artery Disease        Subjective:   Vincent Henry with History of CAD follows with Dr. Shirlee Latch for his heart problems, hypertension, dyslipidemia  is here for routine followup visit has no subjective complaints whatsoever. He has quit smoking and is regular aerobic exercise 5-6 times a week.  Denies any subjective complaints except as above, no active headache, no chest abdominal pain at this time, not short of breath. No focal weakness which is new.    Objective:    Patient Active Problem List   Diagnosis Date Noted  . NSTEMI (non-ST elevated myocardial infarction) 06/28/2013  . CAD (coronary artery disease), native coronary artery 06/28/2013  . Tobacco abuse 06/28/2013  . Hyperlipidemia 06/28/2013  . Sinus bradycardia 06/28/2013  . Elevated TSH 06/28/2013     Filed Vitals:   10/30/13 1405  BP: 113/73  Pulse: 76  Temp: 98 F (36.7 C)  Resp: 16  Weight: 196 lb (88.905 kg)  SpO2: 100%     Exam   Awake Alert, Oriented X 3, No new F.N deficits, Normal affect [place].AT,PERRAL Supple Neck,No JVD, No cervical lymphadenopathy appriciated.  Symmetrical Chest wall movement, Good air movement bilaterally, CTAB RRR,No Gallops,Rubs or new Murmurs, No Parasternal Heave +ve B.Sounds, Abd Soft, Non tender, No organomegaly appriciated, No rebound - guarding or rigidity. No Cyanosis, Clubbing or edema, No new Rash or bruise      Data Review   Lab Results  Component Value Date   WBC 8.8 06/28/2013   HGB 14.3 06/28/2013   HCT 40.4 06/28/2013   MCV 87.1 06/28/2013   PLT 183 06/28/2013      Chemistry      Component Value Date/Time   NA 141 06/28/2013 0511   K 4.0 06/28/2013 0511   CL 111 06/28/2013 0511   CO2 23 06/28/2013 0511   BUN 12  06/28/2013 0511   CREATININE 1.25 06/28/2013 0511      Component Value Date/Time   CALCIUM 9.0 06/28/2013 0511   ALKPHOS 86 08/15/2013 0907   AST 23 08/15/2013 0907   ALT 30 08/15/2013 0907   BILITOT 0.6 08/15/2013 0907       Lab Results  Component Value Date   HGBA1C 5.4 08/28/2013    Lab Results  Component Value Date   CHOL 101 08/15/2013   HDL 32.90* 08/15/2013   LDLCALC 54 08/15/2013   TRIG 71.0 08/15/2013   CHOLHDL 3 08/15/2013    Lab Results  Component Value Date   TSH 1.037 08/28/2013    No results found for this basename: PSA      Prior to Admission medications   Medication Sig Start Date End Date Taking? Authorizing Provider  aspirin 81 MG EC tablet Take 1 tablet (81 mg total) by mouth daily. 08/28/13   Alison Murray, MD  atorvastatin (LIPITOR) 80 MG tablet TAKE 1 TABLET BY MOUTH EVERY EVENING AT [facility] 10/05/13   Laurey Morale, MD  clopidogrel (PLAVIX) 75 MG tablet Day one take 2 tabs to = 150 mg; starting day 2 take 1 tab daily 08/28/13   Alison Murray, MD  loratadine (CLARITIN) 10 MG tablet Take 10 mg by mouth daily as needed. 08/28/13   Wendi Maya  Elisabeth Pigeon, MD  niacin 100 MG tablet Take 1 tablet (100 mg total) by mouth daily after breakfast. 10/30/13   Leroy Sea, MD  nitroGLYCERIN (NITROSTAT) 0.4 MG SL tablet Place 1 tablet (0.4 mg total) under the tongue every 5 (five) minutes as needed for chest pain. 08/28/13   Alison Murray, MD  ramipril (ALTACE) 2.5 MG capsule Take 1 capsule (2.5 mg total) by mouth daily. 09/11/13   Laurey Morale, MD     Assessment & Plan    Hypertension  . Stable Continue low-dose Altace    Dyslipidemia with persistently low HDL. He is already on a statin, does regular exercise, and he is not morbidly obese, advised to eat handful of healthy nuts every night, placed on niacin. Repeat lipid panel in 6 weeks.     Routine health maintenance.   Flu shot given  GI referral for colonoscopy made    Vincent Henry K M.D on 10/30/2013 at 2:14  PM    Patient was given clear instructions to go to ER or return to the clinic if symptoms don't improve, worsen or new problems develop. Patient verbalized understanding. Patient was told to call to get lab results if hasn't heard anything in the next week.

## 2013-12-11 ENCOUNTER — Other Ambulatory Visit: Payer: No Typology Code available for payment source

## 2013-12-28 ENCOUNTER — Encounter: Payer: Self-pay | Admitting: Internal Medicine

## 2014-01-03 ENCOUNTER — Other Ambulatory Visit (HOSPITAL_COMMUNITY): Payer: Self-pay | Admitting: Cardiology

## 2014-04-04 ENCOUNTER — Other Ambulatory Visit (HOSPITAL_COMMUNITY): Payer: Self-pay | Admitting: Cardiology

## 2014-05-14 ENCOUNTER — Encounter: Payer: Self-pay | Admitting: Physician Assistant

## 2014-05-14 ENCOUNTER — Ambulatory Visit (INDEPENDENT_AMBULATORY_CARE_PROVIDER_SITE_OTHER): Payer: Self-pay | Admitting: Physician Assistant

## 2014-05-14 VITALS — BP 118/81 | HR 83 | Ht 69.25 in | Wt 199.8 lb

## 2014-05-14 DIAGNOSIS — I2510 Atherosclerotic heart disease of native coronary artery without angina pectoris: Secondary | ICD-10-CM

## 2014-05-14 MED ORDER — RAMIPRIL 2.5 MG PO CAPS: 2.5000 mg | ORAL_CAPSULE | Freq: Every day | ORAL | Status: DC

## 2014-05-14 MED ORDER — CLOPIDOGREL BISULFATE 75 MG PO TABS: 75.0000 mg | ORAL_TABLET | Freq: Once | ORAL | Status: DC

## 2014-05-14 MED ORDER — ATORVASTATIN CALCIUM 80 MG PO TABS: 80.0000 mg | ORAL_TABLET | Freq: Every day | ORAL | Status: DC

## 2014-05-14 NOTE — Assessment & Plan Note (Signed)
CAD stable without chest pain. Renew ramipril

## 2014-05-14 NOTE — Patient Instructions (Signed)
Your physician recommends that you schedule a follow-up appointment in: WITH [facility] IN 3 MONTHS  Your physician recommends that you continue on your current medications as directed. Please refer to the Current Medication list given to you today.

## 2014-05-14 NOTE — Assessment & Plan Note (Signed)
He quit smoking 

## 2014-05-14 NOTE — Progress Notes (Signed)
HPI:  This is a 53 year old male patient who has history of coronary arteries disease status post NSTEMI in 06/2013 treated with bare-metal stent to the RCA with no significant disease in the LAD or circumflex EF 60%. Plavix is recommended for one year. He also has hyperlipidemia and is a prior smoker. 2-D echo in October 2014 EF was 50-55%.  Patient comes in today because he ran out of his Ramipril last week. 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 He denies any chest pain, palpitations, dyspnea, dyspnea on exertion, dizziness, or presyncope.   No Known Allergies  Current Outpatient Prescriptions on File Prior to Visit: aspirin 81 MG EC tablet, Take 1 tablet (81 mg total) by mouth daily., Disp: 30 tablet, Rfl: 3 atorvastatin (LIPITOR) 80 MG tablet, TAKE 1 TABLET BY MOUTH EVERY EVENING AT 6PM, Disp: 30 tablet, Rfl: 2 clopidogrel (PLAVIX) 75 MG tablet, Day one take 2 tabs to = 150 mg; starting day 2 take 1 tab daily, Disp: 30 tablet, Rfl: 3 loratadine (CLARITIN) 10 MG tablet, Take 10 mg by mouth daily as needed., Disp: , Rfl:  niacin 100 MG tablet, Take 1 tablet (100 mg total) by mouth daily after breakfast., Disp: 30 tablet, Rfl: 1 nitroGLYCERIN (NITROSTAT) 0.4 MG SL tablet, Place 1 tablet (0.4 mg total) under the tongue every 5 (five) minutes as needed for chest pain., Disp: 25 tablet, Rfl: 3 ramipril (ALTACE) 2.5 MG capsule, TAKE 1 CAPSULE BY MOUTH DAILY., Disp: 30 capsule, Rfl: 0  No current facility-administered medications on file prior to visit.   Past Medical History:   CAD (coronary artery disease)                   06/27/2013      Comment:NSTEMI s/p BMS-RCA   Hyperlipidemia                                               Tobacco abuse                                                Sinus bradycardia                                              Comment:HR 40-50s, asymptomatic   Elevated TSH                                                   Comment:12.332  on 06/27/13  Past Surgical History:   CORONARY ANGIOPLASTY WITH STENT PLACEMENT        06/27/2013      Comment:90% prox ulcerated plaque s/p BMS; EF 60%  Review of patient's family history indicates:   CAD                            Brother  Comment: "Stents in his 5840s"   CAD                            Father                     Comment: CABG in his 4960s   Social History   Marital Status: Divorced            Spouse Name:                      Years of Education:                 Number of children:             Occupational History   None on file  Social History Main Topics   Smoking Status: Former Smoker                   Packs/Day: 1.00  Years: 34        Types: Cigarettes   Smokeless Status: Former NeurosurgeonUser                        Quit date: 06/26/2013   Alcohol Use: No             Drug Use: Not on file    Sexual Activity: Not on file        Other Topics            Concern   None on file  Social History Narrative   None on file    ROS: See history of present illness otherwise negative   PHYSICAL EXAM: Well-nournished, in no acute distress. Neck: No JVD, HJR, Bruit, or thyroid enlargement  Lungs: No tachypnea, clear without wheezing, rales, or rhonchi  Cardiovascular: RRR, PMI not displaced, positive S4, no murmur, bruit, thrill, or heave.  Abdomen: BS normal. Soft without organomegaly, masses, lesions or tenderness.  Extremities: without cyanosis, clubbing or edema. Good distal pulses bilateral  SKin: Warm, no lesions or rashes   Musculoskeletal: No deformities  Neuro: no focal signs  BP 118/81  Pulse 83  Ht 5' 9.25" (1.759 m)  Wt 199 lb 12.8 oz (90.629 kg)  BMI 29.29 kg/m2     EKG: Normal sinus rhythm with incomplete right bundle branch block, RVH  2-D echo in 09/2013  Study Conclusions  Left ventricle: The cavity size was normal. Wall thickness was normal. Systolic function was normal. The estimated ejection fraction was in the  range of 50% to 55%.

## 2014-08-22 ENCOUNTER — Ambulatory Visit (INDEPENDENT_AMBULATORY_CARE_PROVIDER_SITE_OTHER): Payer: Self-pay | Admitting: Cardiology

## 2014-08-22 ENCOUNTER — Encounter: Payer: Self-pay | Admitting: Cardiology

## 2014-08-22 VITALS — BP 122/64 | HR 68 | Ht 70.0 in | Wt 200.0 lb

## 2014-08-22 DIAGNOSIS — I2510 Atherosclerotic heart disease of native coronary artery without angina pectoris: Secondary | ICD-10-CM

## 2014-08-22 DIAGNOSIS — Z720 Tobacco use: Secondary | ICD-10-CM

## 2014-08-22 DIAGNOSIS — E785 Hyperlipidemia, unspecified: Secondary | ICD-10-CM

## 2014-08-22 DIAGNOSIS — F17200 Nicotine dependence, unspecified, uncomplicated: Secondary | ICD-10-CM

## 2014-08-22 LAB — LIPID PANEL
CHOL/HDL RATIO: 4
Cholesterol: 98 mg/dL (ref 0–200)
HDL: 24.1 mg/dL — ABNORMAL LOW (ref >39.00)
LDL CALC: 45 mg/dL (ref 0–99)
NonHDL: 73.9
Triglycerides: 145 mg/dL (ref 0.0–149.0)
VLDL: 29 mg/dL (ref 0.0–40.0)

## 2014-08-22 LAB — BASIC METABOLIC PANEL
BUN: 11 mg/dL (ref 6–23)
CHLORIDE: 105 meq/L (ref 96–112)
CO2: 25 meq/L (ref 19–32)
CREATININE: 1.3 mg/dL (ref 0.4–1.5)
Calcium: 9.1 mg/dL (ref 8.4–10.5)
GFR: 60.25 mL/min (ref >60.00)
Glucose, Bld: 85 mg/dL (ref 70–99)
Potassium: 4 mEq/L (ref 3.5–5.1)
Sodium: 138 mEq/L (ref 135–145)

## 2014-08-22 NOTE — Patient Instructions (Signed)
Stop plavix.  Your physician recommends that you have  lab work today--Lipid profile/BMET.  Your physician wants you to follow-up in: 1 year with Dr Shirlee Latch. (September 2016). You will receive a reminder letter in the mail two months in advance. If you don't receive a letter, please call our office to schedule the follow-up appointment.

## 2014-08-23 NOTE — Progress Notes (Signed)
Patient ID: Vincent Henry, male   DOB: [birth year], 53 y.o.   MRN: [id number] PCP:  Jeanann Lewandowsky, MD  Cardiologist:  Dr. Marca Ancona     History of Present Illness: Vincent Henry is a 53 y.o. male with history of NSTEMI in 7/14 who returns for followup.  LHC 06/27/13: Proximal RCA 90% ulcerated plaque, no sig disease in LAD and CFX, EF 60%. PCI: Veriflex BMS to the proximal RCA.  Transitioned 1 month after PCI from Eliquis to Plavix.  Post PCI course uneventful. Echo in 10/14 showed EF 50-55%.    Patient is doing well. He is not smoking.  He does not have any exertional dyspnea or chest pain.  He is back at work Social worker.    Labs (7/14):  K 4, Cr 1.25, LDL 101, Hgb 14.3, TSH 12.332  Labs (9/14): TSH 1.037 (normal), A1c 5.4, LDL 54, HDL 33  Wt Readings from Last 3 Encounters:  08/22/14 200 lb (90.719 kg)  05/14/14 199 lb 12.8 oz (90.629 kg)  10/30/13 196 lb (88.905 kg)    PMH: 1. CAD: NSTEMI (7/14) with 90% pRCA stenosis.  Had BMS to RCA.  Echo (10/14) with EF 50-55%.   2. Prior smoker 3. Hyperlipidemia 4. 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  Quit smoking in 7/14.   FH: No premature CAD  Current Outpatient Prescriptions  Medication Sig Dispense Refill  . aspirin 81 MG EC tablet Take 1 tablet (81 mg total) by mouth daily.  30 tablet  3  . atorvastatin (LIPITOR) 80 MG tablet Take 1 tablet (80 mg total) by mouth daily at 6 PM.  90 tablet  2  . loratadine (CLARITIN) 10 MG tablet Take 10 mg by mouth daily as needed.      . nitroGLYCERIN (NITROSTAT) 0.4 MG SL tablet Place 1 tablet (0.4 mg total) under the tongue every 5 (five) minutes as needed for chest pain.  25 tablet  3  . ramipril (ALTACE) 2.5 MG capsule Take 1 capsule (2.5 mg total) by mouth daily.  90 capsule  3   No current facility-administered medications for this visit.    Allergies:   No Known Allergies  ROS:  Please see the history of present illness.      All other systems  reviewed and negative.   PHYSICAL EXAM: VS:  BP 122/64  Pulse 68  Ht  (1.778 m)  Wt 200 lb (90.719 kg)  BMI 28.70 kg/m2  SpO2 94% Well nourished, well developed, in no acute distress HEENT: normal Neck: no JVD, no carotid bruits Cardiac:  normal S1, S2; RRR; no murmur, no carotid bruit, normal pedal pulses.  Lungs:  clear to auscultation bilaterally, no wheezing, rhonchi or rales Abd: soft, nontender, no hepatomegaly Ext: no edema; No clubbing/cyanosis Skin: warm and dry  ASSESSMENT AND PLAN:  1. CAD:  Doing well after 7/14 non-STEMI treated with a bare-metal stent the RCA. EF 50-55% on echo.  On ASA/Plavix for 1 year, can stop Plavix at this point.  Continue statin, ramipril.  He has not been on beta blocker with bradycardia.  2. Hyperlipidemia:  Continue high potency statin.  Check lipids.   3. Elevated TSH:  Now resolved, suspect sick euthyroid at time of NSTEMI.   4. Tobacco Abuse:  He has quit smoking.  Encouraged to stay quit 5.  Laurey Morale, MD  08/23/2014

## 2014-11-22 ENCOUNTER — Encounter (HOSPITAL_COMMUNITY): Payer: Self-pay | Admitting: Cardiovascular Disease

## 2015-01-07 IMAGING — CR DG CHEST 2V
2 series · 2 of 2 positions shown · non-contrast
Comparison: None.

CLINICAL DATA: Chest pain, cough

CHEST - 2 VIEW

[w chest pa]
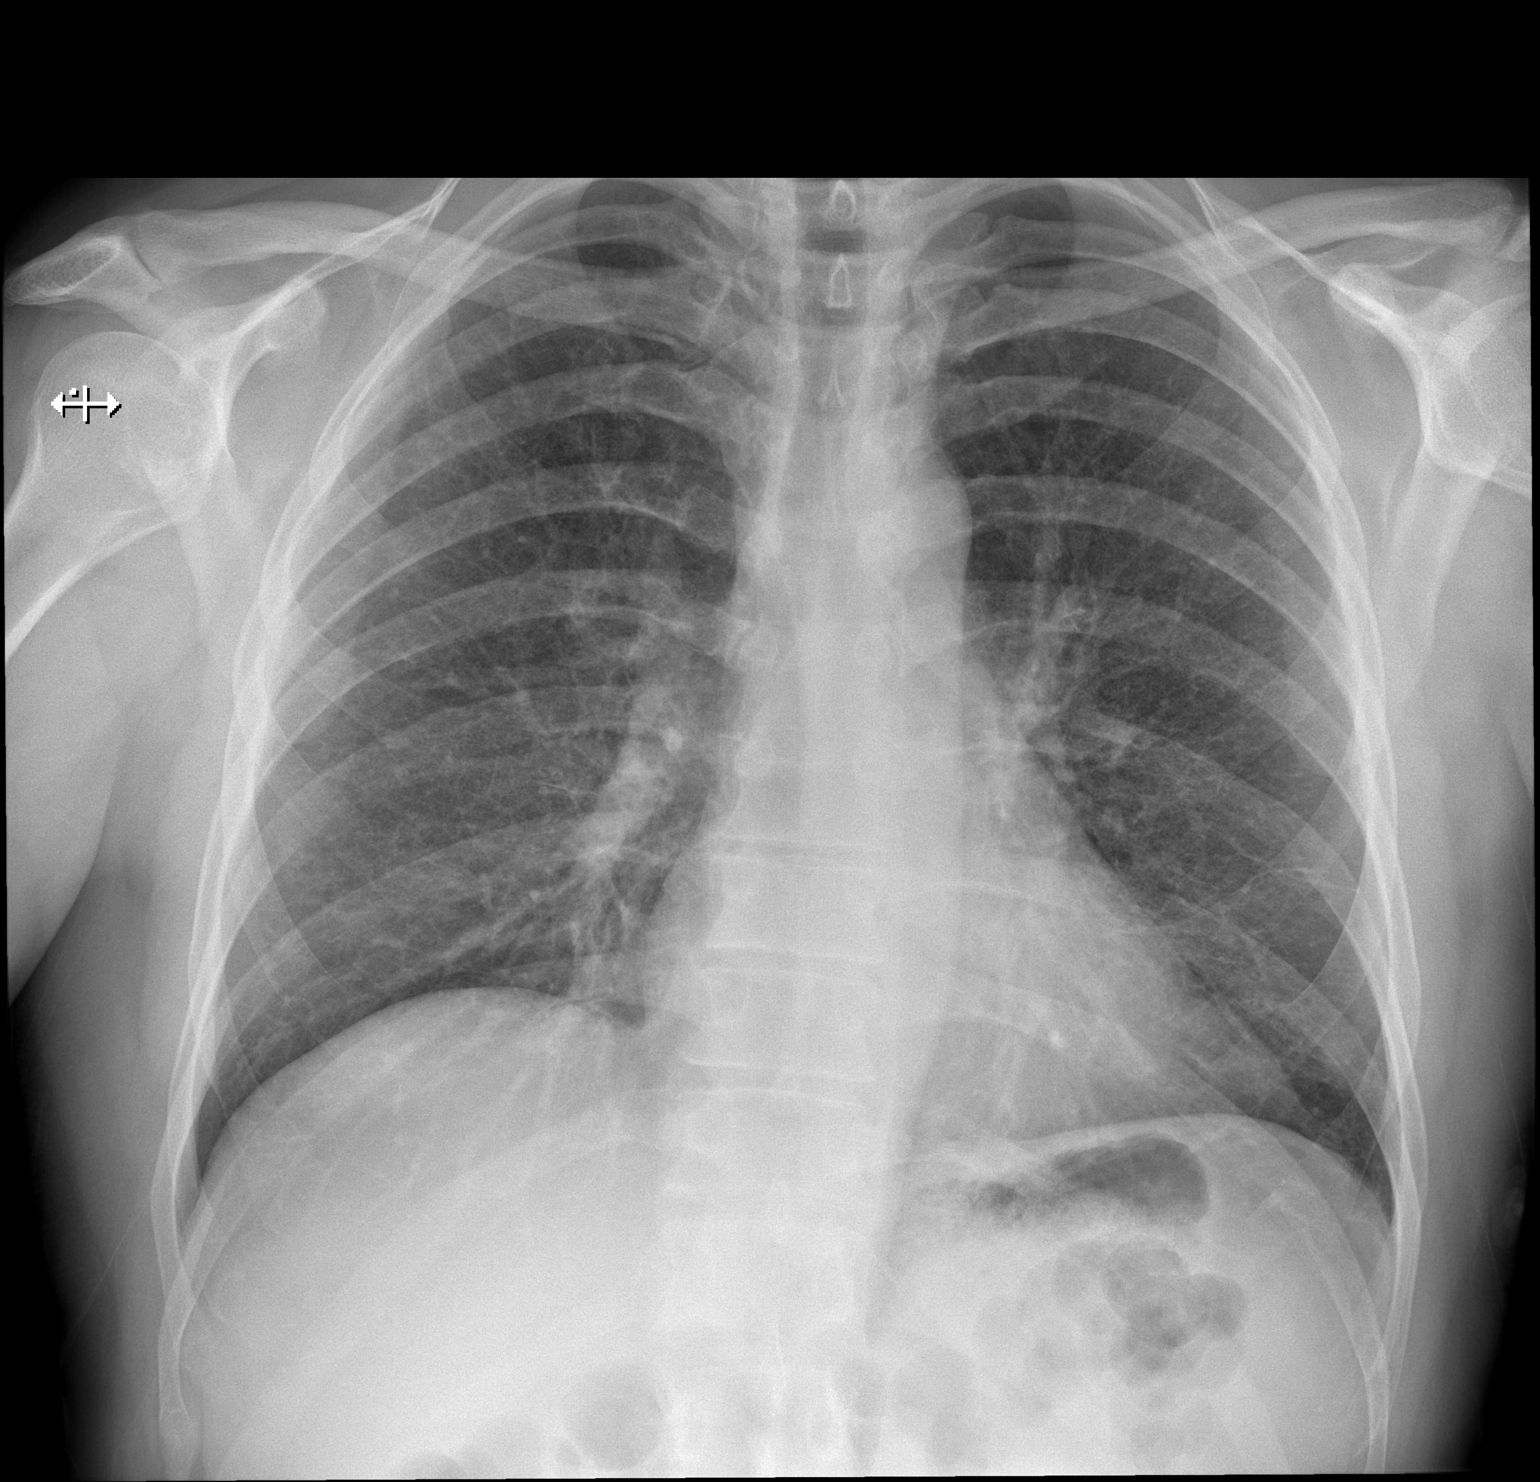

[w chest lat]
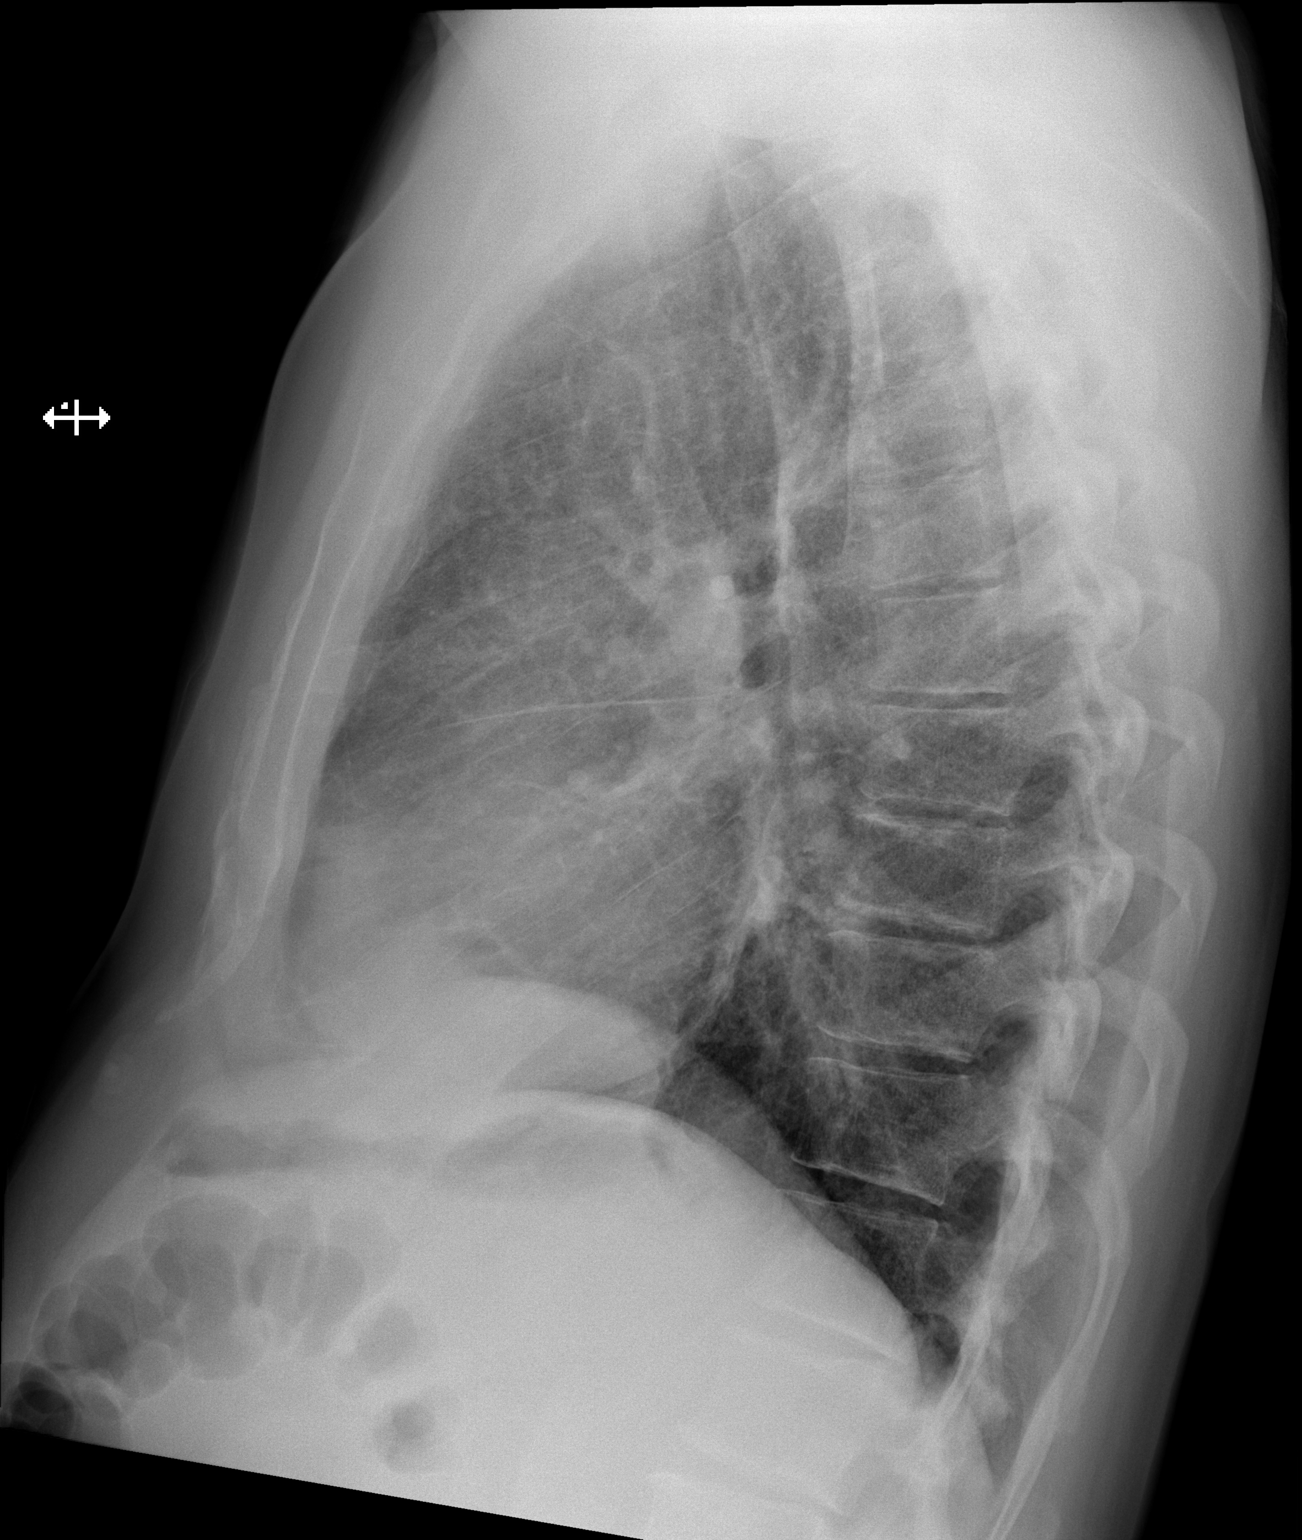

[2 of 2 positions shown; findings below may reference images not displayed]

FINDINGS: There is mild diffuse interstitial prominence.  No focal
airspace consolidation.  No effusion.  No overt edema.  Heart size
normal.  Regional bones unremarkable.
IMPRESSION: Mild interstitial prominence of uncertain chronicity.

## 2015-04-23 ENCOUNTER — Other Ambulatory Visit: Payer: Self-pay | Admitting: Physician Assistant

## 2015-10-29 ENCOUNTER — Ambulatory Visit (INDEPENDENT_AMBULATORY_CARE_PROVIDER_SITE_OTHER): Payer: BLUE CROSS/BLUE SHIELD | Admitting: Cardiology

## 2015-10-29 ENCOUNTER — Encounter: Payer: Self-pay | Admitting: Cardiology

## 2015-10-29 VITALS — BP 136/84 | HR 75 | Ht 70.0 in | Wt 192.4 lb

## 2015-10-29 DIAGNOSIS — E785 Hyperlipidemia, unspecified: Secondary | ICD-10-CM

## 2015-10-29 DIAGNOSIS — I2510 Atherosclerotic heart disease of native coronary artery without angina pectoris: Secondary | ICD-10-CM

## 2015-10-29 LAB — BASIC METABOLIC PANEL
BUN: 7 mg/dL (ref 7–25)
CHLORIDE: 105 mmol/L (ref 98–110)
CO2: 24 mmol/L (ref 20–31)
CREATININE: 1.11 mg/dL (ref 0.70–1.33)
Calcium: 9.4 mg/dL (ref 8.6–10.3)
Glucose, Bld: 79 mg/dL (ref 65–99)
Potassium: 4.2 mmol/L (ref 3.5–5.3)
Sodium: 139 mmol/L (ref 135–146)

## 2015-10-29 LAB — CBC WITH DIFFERENTIAL/PLATELET
Basophils Absolute: 0.1 10*3/uL (ref 0.0–0.1)
Basophils Relative: 1 % (ref 0–1)
Eosinophils Absolute: 0.3 10*3/uL (ref 0.0–0.7)
Eosinophils Relative: 3 % (ref 0–5)
HCT: 47.1 % (ref 39.0–52.0)
HEMOGLOBIN: 16.2 g/dL (ref 13.0–17.0)
LYMPHS PCT: 35 % (ref 12–46)
Lymphs Abs: 3.6 10*3/uL (ref 0.7–4.0)
MCH: 30.8 pg (ref 26.0–34.0)
MCHC: 34.4 g/dL (ref 30.0–36.0)
MCV: 89.5 fL (ref 78.0–100.0)
MONO ABS: 0.9 10*3/uL (ref 0.1–1.0)
MPV: 10.1 fL (ref 8.6–12.4)
Monocytes Relative: 9 % (ref 3–12)
Neutro Abs: 5.3 10*3/uL (ref 1.7–7.7)
Neutrophils Relative %: 52 % (ref 43–77)
Platelets: 248 10*3/uL (ref 150–400)
RBC: 5.26 MIL/uL (ref 4.22–5.81)
RDW: 13.6 % (ref 11.5–15.5)
WBC: 10.2 10*3/uL (ref 4.0–10.5)

## 2015-10-29 LAB — LIPID PANEL
CHOLESTEROL: 147 mg/dL (ref 125–200)
HDL: 35 mg/dL — AB (ref >=40)
LDL CALC: 92 mg/dL (ref <130)
TRIGLYCERIDES: 98 mg/dL (ref <150)
Total CHOL/HDL Ratio: 4.2 Ratio (ref <=5.0)
VLDL: 20 mg/dL (ref <30)

## 2015-10-29 MED ORDER — RAMIPRIL 2.5 MG PO CAPS: 2.5000 mg | ORAL_CAPSULE | Freq: Every day | ORAL | Status: DC

## 2015-10-29 NOTE — Patient Instructions (Signed)
Medication Instructions:  No changes today  Labwork: BMET/CBCd/Lipid profile today  Testing/Procedures: None today  Follow-Up: Your physician wants you to follow-up in: 1 year with Dr Shirlee LatchMcLean. (November 2017). You will receive a reminder letter in the mail two months in advance. If you don't receive a letter, please call our office to schedule the follow-up appointment.        If you need a refill on your cardiac medications before your next appointment, please call your pharmacy.

## 2015-10-29 NOTE — Progress Notes (Signed)
Patient ID: Vincent Henry, male   DOB: 02/25/1961, 54 y.o.   MRN: [id number] PCP:  Jeanann LewandowskyJEGEDE, OLUGBEMIGA, MD  Cardiologist:  Dr. Marca Anconaalton Beulah Capobianco     History of Present Illness: Vincent Henry is a 54 y.o. male with history of NSTEMI in 7/14 who returns for followup.  LHC 06/27/13: Proximal RCA 90% ulcerated plaque, no sig disease in LAD and CFX, EF 60%. PCI: Veriflex BMS to the proximal RCA. Post PCI course uneventful. Echo in 10/14 showed EF 50-55%.    Patient is doing well. He is not smoking.  He does not have any exertional dyspnea or chest pain. He works full time Social workerrepairing golf carts.    Labs (7/14):  K 4, Cr 1.25, LDL 101, Hgb 14.3, TSH 12.332  Labs (9/14): TSH 1.037 (normal), A1c 5.4, LDL 54, HDL 33 Labs (9/15): K 4, creatinine 1.3, LDL 45, HDL 29  ECG: NSR, iRBBB  Wt Readings from Last 3 Encounters:  10/29/15 192 lb 6.4 oz (87.272 kg)  08/22/14 200 lb (90.719 kg)  05/14/14 199 lb 12.8 oz (90.629 kg)    PMH: 1. CAD: NSTEMI (7/14) with 90% pRCA stenosis.  Had BMS to RCA.  Echo (10/14) with EF 50-55%.   2. Prior smoker 3. Hyperlipidemia 4. Sinus bradycardia  SH: Works Social workerrepairing golf carts. [place]  Quit smoking in 7/14.   FH: No premature CAD  Current Outpatient Prescriptions  Medication Sig Dispense Refill  . aspirin 81 MG EC tablet Take 1 tablet (81 mg total) by mouth daily. 30 tablet 3  . atorvastatin (LIPITOR) 80 MG tablet Take 1 tablet (80 mg total) by mouth daily at 6 PM. 90 tablet 2  . loratadine (CLARITIN) 10 MG tablet Take 10 mg by mouth daily as needed for allergies.     . nitroGLYCERIN (NITROSTAT) 0.4 MG SL tablet Place 1 tablet (0.4 mg total) under the tongue every 5 (five) minutes as needed for chest pain. 25 tablet 3  . ramipril (ALTACE) 2.5 MG capsule Take 1 capsule (2.5 mg total) by mouth daily. 90 capsule 3   No current facility-administered medications for this visit.    Allergies:   No Known Allergies  ROS:  Please see the history of present illness.   All other systems reviewed and negative.   PHYSICAL EXAM: VS:  BP 136/84 mmHg  Pulse 75  Ht 5\' 10"  (1.778 m)  Wt 192 lb 6.4 oz (87.272 kg)  BMI 27.61 kg/m2 Well nourished, well developed, in no acute distress HEENT: normal Neck: no JVD, no carotid bruits Cardiac:  normal S1, S2; RRR; no murmur, no carotid bruit, normal pedal pulses.  Lungs:  clear to auscultation bilaterally, no wheezing, rhonchi or rales Abd: soft, nontender, no hepatomegaly Ext: no edema; No clubbing/cyanosis Skin: warm and dry  ASSESSMENT AND PLAN:  1. CAD:  Doing well after 7/14 non-STEMI treated with a bare-metal stent the RCA. EF 50-55% on echo.  Continue ASA 81, statin, ramipril.  He has not been on beta blocker with history of bradycardia.  2. Hyperlipidemia:  Continue statin, check lipids/LFTs today.  3. Tobacco Abuse:  He has quit smoking.  Encouraged to stay quit  Laurey Moralealton S Samule Life, MD  10/29/2015

## 2015-11-01 ENCOUNTER — Telehealth: Payer: Self-pay

## 2015-11-01 DIAGNOSIS — E785 Hyperlipidemia, unspecified: Secondary | ICD-10-CM

## 2015-11-01 MED ORDER — ROSUVASTATIN CALCIUM 40 MG PO TABS: 40.0000 mg | ORAL_TABLET | Freq: Every day | ORAL | Status: DC

## 2015-11-01 NOTE — Telephone Encounter (Signed)
Called patient about lab results. Per Dr. Shirlee LatchMcLean, LDL higher than goal (< 70). If he is taking atorvastatin 80 mg daily, have him switch to Crestor 40 mg daily with lipids/LFTs in 2 months. He can take generic rosuvastatin. Patient verbalized understanding.

## 2016-01-01 ENCOUNTER — Other Ambulatory Visit: Payer: BLUE CROSS/BLUE SHIELD

## 2016-11-03 ENCOUNTER — Other Ambulatory Visit: Payer: Self-pay | Admitting: Cardiology

## 2016-11-03 DIAGNOSIS — I2510 Atherosclerotic heart disease of native coronary artery without angina pectoris: Secondary | ICD-10-CM

## 2016-11-03 DIAGNOSIS — E785 Hyperlipidemia, unspecified: Secondary | ICD-10-CM
